# Patient Record
Sex: Male | Born: 1950 | Race: White | Hispanic: No | Smoking: Never smoker
Health system: Southern US, Community
[De-identification: ages and names within clinical notes are randomized; demographics above are authoritative.]

## PROBLEM LIST (undated history)

## (undated) DIAGNOSIS — I4891 Unspecified atrial fibrillation: Secondary | ICD-10-CM

## (undated) DIAGNOSIS — I10 Essential (primary) hypertension: Secondary | ICD-10-CM

## (undated) DIAGNOSIS — L570 Actinic keratosis: Secondary | ICD-10-CM

## (undated) HISTORY — DX: Essential (primary) hypertension: I10

## (undated) HISTORY — PX: CORNEAL TRANSPLANT: SHX108

## (undated) HISTORY — PX: TOE SURGERY: SHX1073

## (undated) HISTORY — PX: COLONOSCOPY: SHX174

---
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
## 2005-08-13 ENCOUNTER — Ambulatory Visit: Payer: Self-pay | Admitting: Unknown Physician Specialty

## 2008-10-30 ENCOUNTER — Ambulatory Visit: Payer: Self-pay | Admitting: Unknown Physician Specialty

## 2008-10-30 LAB — HM COLONOSCOPY

## 2011-08-20 ENCOUNTER — Ambulatory Visit: Payer: Self-pay | Admitting: Family Medicine

## 2011-09-02 ENCOUNTER — Ambulatory Visit: Payer: Self-pay | Admitting: Internal Medicine

## 2011-09-17 ENCOUNTER — Ambulatory Visit: Payer: Self-pay | Admitting: Internal Medicine

## 2014-02-23 LAB — LIPID PANEL
CHOLESTEROL: 176 mg/dL (ref 0–200)
HDL: 48 mg/dL (ref 35–70)
LDL Cholesterol: 113 mg/dL
LDl/HDL Ratio: 2.4
Triglycerides: 77 mg/dL (ref 40–160)

## 2014-02-23 LAB — PSA: PSA: 2.1

## 2014-02-23 LAB — TSH: TSH: 4.87 u[IU]/mL (ref 0.41–5.90)

## 2014-05-31 ENCOUNTER — Ambulatory Visit: Payer: Self-pay | Admitting: Family Medicine

## 2014-05-31 LAB — CBC AND DIFFERENTIAL
HCT: 41 % (ref 41–53)
Hemoglobin: 14.4 g/dL (ref 13.5–17.5)
NEUTROS ABS: 4 /uL
PLATELETS: 277 10*3/uL (ref 150–399)
WBC: 7.1 10^3/mL

## 2014-05-31 LAB — HEPATIC FUNCTION PANEL
ALK PHOS: 64 U/L (ref 25–125)
ALT: 13 U/L (ref 10–40)
AST: 13 U/L — AB (ref 14–40)
Bilirubin, Total: 0.5 mg/dL

## 2014-05-31 LAB — BASIC METABOLIC PANEL
BUN: 14 mg/dL (ref 4–21)
Creatinine: 1 mg/dL (ref 0.6–1.3)
GLUCOSE: 88 mg/dL
Potassium: 3.7 mmol/L (ref 3.4–5.3)
Sodium: 139 mmol/L (ref 137–147)

## 2014-09-04 DIAGNOSIS — G4733 Obstructive sleep apnea (adult) (pediatric): Secondary | ICD-10-CM | POA: Insufficient documentation

## 2015-01-02 DIAGNOSIS — L570 Actinic keratosis: Secondary | ICD-10-CM

## 2015-01-02 HISTORY — DX: Actinic keratosis: L57.0

## 2015-05-26 ENCOUNTER — Other Ambulatory Visit: Payer: Self-pay | Admitting: Family Medicine

## 2015-05-29 ENCOUNTER — Encounter: Payer: Self-pay | Admitting: Family Medicine

## 2015-06-21 ENCOUNTER — Encounter: Payer: Self-pay | Admitting: Emergency Medicine

## 2015-06-21 DIAGNOSIS — M810 Age-related osteoporosis without current pathological fracture: Secondary | ICD-10-CM | POA: Insufficient documentation

## 2015-06-21 DIAGNOSIS — I499 Cardiac arrhythmia, unspecified: Secondary | ICD-10-CM | POA: Insufficient documentation

## 2015-06-21 DIAGNOSIS — E039 Hypothyroidism, unspecified: Secondary | ICD-10-CM | POA: Insufficient documentation

## 2015-06-21 DIAGNOSIS — G4730 Sleep apnea, unspecified: Secondary | ICD-10-CM | POA: Insufficient documentation

## 2015-06-21 DIAGNOSIS — J309 Allergic rhinitis, unspecified: Secondary | ICD-10-CM | POA: Insufficient documentation

## 2015-06-21 DIAGNOSIS — M1990 Unspecified osteoarthritis, unspecified site: Secondary | ICD-10-CM | POA: Insufficient documentation

## 2015-06-21 DIAGNOSIS — M069 Rheumatoid arthritis, unspecified: Secondary | ICD-10-CM | POA: Insufficient documentation

## 2015-06-21 DIAGNOSIS — I4891 Unspecified atrial fibrillation: Secondary | ICD-10-CM | POA: Insufficient documentation

## 2015-06-26 ENCOUNTER — Encounter: Payer: Self-pay | Admitting: Family Medicine

## 2015-07-24 ENCOUNTER — Encounter: Payer: Self-pay | Admitting: Family Medicine

## 2015-08-05 ENCOUNTER — Ambulatory Visit (INDEPENDENT_AMBULATORY_CARE_PROVIDER_SITE_OTHER): Payer: BLUE CROSS/BLUE SHIELD | Admitting: Family Medicine

## 2015-08-05 VITALS — BP 102/58 | HR 58 | Temp 98.4°F | Resp 16 | Ht 70.5 in | Wt 211.0 lb

## 2015-08-05 DIAGNOSIS — K921 Melena: Secondary | ICD-10-CM

## 2015-08-05 DIAGNOSIS — G4730 Sleep apnea, unspecified: Secondary | ICD-10-CM

## 2015-08-05 DIAGNOSIS — Z0000 Encounter for general adult medical examination without abnormal findings: Secondary | ICD-10-CM

## 2015-08-05 DIAGNOSIS — Z125 Encounter for screening for malignant neoplasm of prostate: Secondary | ICD-10-CM

## 2015-08-05 DIAGNOSIS — Z1211 Encounter for screening for malignant neoplasm of colon: Secondary | ICD-10-CM

## 2015-08-05 LAB — POCT URINALYSIS DIPSTICK
Bilirubin, UA: NEGATIVE
Blood, UA: NEGATIVE
Glucose, UA: NEGATIVE
KETONES UA: NEGATIVE
LEUKOCYTES UA: NEGATIVE
Nitrite, UA: NEGATIVE
PH UA: 6
PROTEIN UA: NEGATIVE
SPEC GRAV UA: 1.01
UROBILINOGEN UA: NEGATIVE

## 2015-08-05 LAB — IFOBT (OCCULT BLOOD): IMMUNOLOGICAL FECAL OCCULT BLOOD TEST: POSITIVE

## 2015-08-05 NOTE — Progress Notes (Signed)
Patient ID: John Baxter, male   DOB: August 24, 1950, 64 y.o.   MRN: [id number] Patient: John Baxter, Male    DOB: 1951/04/12, 64 y.o.   MRN: [id number] Visit Date: 08/05/2015  Today's Provider: [name], MD   Chief Complaint  Patient presents with  . Annual Exam   Subjective:  John Baxter is a 64 y.o. male who presents today for health maintenance and complete physical. He feels well. He reports exercising walks daily. He reports he is sleeping well   Review of Systems  Constitutional: Negative for fever, chills, diaphoresis, activity change, appetite change, fatigue and unexpected weight change.  HENT: Negative for congestion, dental problem, drooling, ear discharge, ear pain, facial swelling, hearing loss, mouth sores, nosebleeds, postnasal drip, rhinorrhea, sinus pressure, sneezing, sore throat, tinnitus, trouble swallowing and voice change.   Eyes: Negative for photophobia, pain, discharge, redness, itching and visual disturbance.  Respiratory: Positive for apnea. Negative for cough, choking, chest tightness, shortness of breath, wheezing and stridor.   Cardiovascular: Negative for chest pain, palpitations and leg swelling.  Gastrointestinal: Negative for nausea, vomiting, abdominal pain, diarrhea, constipation, blood in stool, abdominal distention, anal bleeding and rectal pain.  Endocrine: Negative for cold intolerance, heat intolerance, polydipsia, polyphagia and polyuria.  Genitourinary: Negative for dysuria, urgency, frequency, hematuria, flank pain, decreased urine volume, discharge, penile swelling, scrotal swelling, enuresis, difficulty urinating, genital sores, penile pain and testicular pain.  Musculoskeletal: Negative for myalgias, back pain, joint swelling, arthralgias, gait problem, neck pain and neck stiffness.  Skin: Negative for color change, pallor, rash and wound.  Allergic/Immunologic: Negative for environmental allergies, food allergies and  immunocompromised state.  Neurological: Negative for dizziness, tremors, seizures, syncope, facial asymmetry, speech difficulty, weakness, light-headedness, numbness and headaches.  Hematological: Negative for adenopathy. Does not bruise/bleed easily.  Psychiatric/Behavioral: Negative for suicidal ideas, hallucinations, behavioral problems, confusion, sleep disturbance, self-injury, dysphoric mood, decreased concentration and agitation. The patient is not nervous/anxious and is not hyperactive.     Social History   Social History  . Marital Status: Married    Spouse Name: N/A  . Number of Children: N/A  . Years of Education: N/A   Occupational History  . Not on file.   Social History Main Topics  . Smoking status: Never Smoker   . Smokeless tobacco: Not on file  . Alcohol Use: Yes     Comment: occasionally   . Drug Use: No  . Sexual Activity: Not on file   Other Topics Concern  . Not on file   Social History Narrative  . No narrative on file    Patient Active Problem List   Diagnosis Date Noted  . Allergic rhinitis 06/21/2015  . Cardiac dysrhythmia 06/21/2015  . Atrial fibrillation ([place]) 06/21/2015  . Adult hypothyroidism 06/21/2015  . Arthritis, degenerative 06/21/2015  . OP (osteoporosis) 06/21/2015  . Rheumatoid arthritis ([place]) 06/21/2015  . Apnea, sleep 06/21/2015    Past Surgical History  Procedure Laterality Date  . Toe surgery      Ingrown nail x2    His family history includes Arthritis in his mother; Blindness (age of onset: 48) in his father; Cancer in his father; Heart disease in his father; Stroke in his mother.    Outpatient Prescriptions Prior to Visit  Medication Sig Dispense Refill  . ADALIMUMAB Inject into the skin.    Marland Kitchen aspirin (ASPIRIN ADULT LOW STRENGTH) 81 MG chewable tablet Chew by mouth.    . folic acid (FOLVITE) 1 MG tablet Take by  mouth.    . methotrexate 2.5 MG tablet Take by mouth.    . metoprolol succinate (TOPROL XL) 25 MG 24 hr  tablet Take by mouth.    . SYNTHROID 88 MCG tablet TAKE 1 TABLET BY MOUTH EVERY DAY 30 tablet 6   No facility-administered medications prior to visit.   Outpatient Encounter Prescriptions as of 08/05/2015  Medication Sig Note  . ADALIMUMAB Inject into the skin. 06/21/2015: Received from: [facility]  . aspirin (ASPIRIN ADULT LOW STRENGTH) 81 MG chewable tablet Chew by mouth. 06/21/2015: Received from: [facility]  . folic acid (FOLVITE) 1 MG tablet Take by mouth. 06/21/2015: Received from: [facility]  . methotrexate 2.5 MG tablet Take by mouth. 06/21/2015: Received from: [facility]  . metoprolol succinate (TOPROL XL) 25 MG 24 hr tablet Take by mouth. 06/21/2015: Received from: [facility]  . SYNTHROID 88 MCG tablet TAKE 1 TABLET BY MOUTH EVERY DAY    No facility-administered encounter medications on file as of 08/05/2015.    Patient Care Team: Jerrol Banana., MD as PCP - General (Family Medicine)     Objective:   Vitals:  Filed Vitals:   08/05/15 0913  BP: 102/58  Pulse: 58  Temp: 98.4 F (36.9 C)  TempSrc: Oral  Resp: 16  Height: 5' 10.5" (1.791 m)  Weight: 211 lb (95.709 kg)    Physical Exam  Constitutional: He is oriented to person, place, and time. He appears well-developed and well-nourished.  HENT:  Head: Normocephalic and atraumatic.  Right Ear: External ear normal.  Left Ear: External ear normal.  Nose: Nose normal.  Mouth/Throat: Oropharynx is clear and moist.  3+ tonsils  Eyes: Conjunctivae and EOM are normal. Pupils are equal, round, and reactive to light.  Neck: Normal range of motion. Neck supple.  Cardiovascular: Normal rate, regular rhythm, normal heart sounds and intact distal pulses.   Pulmonary/Chest: Effort normal and breath sounds normal.  Abdominal: Soft. Bowel sounds are normal.  Genitourinary: Rectum normal, prostate normal and penis normal.   Musculoskeletal: Normal range of motion.  Neurological: He is alert and oriented to person, place, and time.  Skin: Skin is warm and dry.  Psychiatric: He has a normal mood and affect. His behavior is normal. Judgment and thought content normal.     Depression Screen No flowsheet data found.    Assessment & Plan:     Routine Health Maintenance and Physical Exam  Exercise Activities and Dietary recommendations Goals    None      Immunization History  Administered Date(s) Administered  . Tdap 09/11/2008    Health Maintenance  Topic Date Due  . Hepatitis C Screening  [birth year]  . HIV Screening  11/20/1965  . ZOSTAVAX  11/21/2010  . INFLUENZA VACCINE  03/18/2015  . TETANUS/TDAP  09/11/2018  . COLONOSCOPY  10/31/2018   1. Annual physical exam  - CBC With Differential/Platelet - Lipid Panel With LDL/HDL Ratio - COMPLETE METABOLIC PANEL WITH GFR - TSH - POCT urinalysis dipstick  2. Prostate cancer screening  - PSA  3. Blood in stool  - Ambulatory referral to Gastroenterology  4. Colon cancer screening  - IFOBT POC (occult bld, rslt in office)  5. Sleep apnea Patient uses CPAP nightly. Discussed possible ENT for enlarged tonsils as this might be contributing to OSA. Patient to schedule for flu vaccine-Not done today due to him taking his Humira injection and his Methotrexate.    Discussed health benefits of  physical activity, and encouraged him to engage in regular exercise appropriate for his age and condition.    ------------------------------------------------------------------------------------------------------------

## 2015-08-13 LAB — CBC WITH DIFFERENTIAL/PLATELET

## 2015-08-13 LAB — COMPREHENSIVE METABOLIC PANEL
A/G RATIO: 1.3 (ref 1.1–2.5)
ALBUMIN: 3.9 g/dL (ref 3.6–4.8)
ALT: 18 IU/L (ref 0–44)
AST: 18 IU/L (ref 0–40)
Alkaline Phosphatase: 53 IU/L (ref 39–117)
BUN/Creatinine Ratio: 18 (ref 10–22)
BUN: 17 mg/dL (ref 8–27)
Bilirubin Total: 0.6 mg/dL (ref 0.0–1.2)
CALCIUM: 8.8 mg/dL (ref 8.6–10.2)
CHLORIDE: 106 mmol/L (ref 96–106)
CO2: 24 mmol/L (ref 18–29)
CREATININE: 0.95 mg/dL (ref 0.76–1.27)
GFR, EST AFRICAN AMERICAN: 97 mL/min/{1.73_m2} (ref >59)
GFR, EST NON AFRICAN AMERICAN: 84 mL/min/{1.73_m2} (ref >59)
GLOBULIN, TOTAL: 3.1 g/dL (ref 1.5–4.5)
Glucose: 101 mg/dL — ABNORMAL HIGH (ref 65–99)
Potassium: 4.4 mmol/L (ref 3.5–5.2)
Sodium: 142 mmol/L (ref 134–144)
TOTAL PROTEIN: 7 g/dL (ref 6.0–8.5)

## 2015-08-13 LAB — LIPID PANEL WITH LDL/HDL RATIO
Cholesterol, Total: 161 mg/dL (ref 100–199)
HDL: 53 mg/dL (ref >39)
LDL Calculated: 99 mg/dL (ref 0–99)
LDl/HDL Ratio: 1.9 ratio units (ref 0.0–3.6)
TRIGLYCERIDES: 47 mg/dL (ref 0–149)
VLDL CHOLESTEROL CAL: 9 mg/dL (ref 5–40)

## 2015-08-13 LAB — PSA: Prostate Specific Ag, Serum: 2.2 ng/mL (ref 0.0–4.0)

## 2015-08-13 LAB — TSH: TSH: 4.26 u[IU]/mL (ref 0.450–4.500)

## 2015-08-14 ENCOUNTER — Ambulatory Visit (INDEPENDENT_AMBULATORY_CARE_PROVIDER_SITE_OTHER): Payer: BLUE CROSS/BLUE SHIELD

## 2015-08-14 DIAGNOSIS — Z23 Encounter for immunization: Secondary | ICD-10-CM

## 2015-10-23 LAB — HM COLONOSCOPY: HM Colonoscopy: 1

## 2015-12-02 ENCOUNTER — Encounter: Payer: Self-pay | Admitting: Family Medicine

## 2015-12-20 ENCOUNTER — Other Ambulatory Visit: Payer: Self-pay | Admitting: Family Medicine

## 2015-12-28 ENCOUNTER — Other Ambulatory Visit: Payer: Self-pay | Admitting: Family Medicine

## 2016-01-24 DIAGNOSIS — M0579 Rheumatoid arthritis with rheumatoid factor of multiple sites without organ or systems involvement: Secondary | ICD-10-CM | POA: Diagnosis not present

## 2016-01-24 DIAGNOSIS — M150 Primary generalized (osteo)arthritis: Secondary | ICD-10-CM | POA: Diagnosis not present

## 2016-01-31 DIAGNOSIS — M0579 Rheumatoid arthritis with rheumatoid factor of multiple sites without organ or systems involvement: Secondary | ICD-10-CM | POA: Diagnosis not present

## 2016-01-31 DIAGNOSIS — M150 Primary generalized (osteo)arthritis: Secondary | ICD-10-CM | POA: Diagnosis not present

## 2016-02-03 ENCOUNTER — Ambulatory Visit: Payer: BLUE CROSS/BLUE SHIELD | Admitting: Family Medicine

## 2016-02-13 DIAGNOSIS — H6123 Impacted cerumen, bilateral: Secondary | ICD-10-CM | POA: Diagnosis not present

## 2016-02-13 DIAGNOSIS — H93293 Other abnormal auditory perceptions, bilateral: Secondary | ICD-10-CM | POA: Diagnosis not present

## 2016-02-28 DIAGNOSIS — I480 Paroxysmal atrial fibrillation: Secondary | ICD-10-CM | POA: Diagnosis not present

## 2016-02-28 DIAGNOSIS — R001 Bradycardia, unspecified: Secondary | ICD-10-CM | POA: Diagnosis not present

## 2016-02-28 DIAGNOSIS — I340 Nonrheumatic mitral (valve) insufficiency: Secondary | ICD-10-CM | POA: Diagnosis not present

## 2016-02-28 DIAGNOSIS — G4733 Obstructive sleep apnea (adult) (pediatric): Secondary | ICD-10-CM | POA: Diagnosis not present

## 2016-05-19 DIAGNOSIS — M0579 Rheumatoid arthritis with rheumatoid factor of multiple sites without organ or systems involvement: Secondary | ICD-10-CM | POA: Diagnosis not present

## 2016-06-24 ENCOUNTER — Ambulatory Visit (INDEPENDENT_AMBULATORY_CARE_PROVIDER_SITE_OTHER): Payer: BLUE CROSS/BLUE SHIELD | Admitting: Family Medicine

## 2016-06-24 DIAGNOSIS — Z23 Encounter for immunization: Secondary | ICD-10-CM | POA: Diagnosis not present

## 2016-07-31 DIAGNOSIS — M0579 Rheumatoid arthritis with rheumatoid factor of multiple sites without organ or systems involvement: Secondary | ICD-10-CM | POA: Diagnosis not present

## 2016-08-05 ENCOUNTER — Ambulatory Visit (INDEPENDENT_AMBULATORY_CARE_PROVIDER_SITE_OTHER): Payer: BLUE CROSS/BLUE SHIELD | Admitting: Family Medicine

## 2016-08-05 ENCOUNTER — Encounter: Payer: Self-pay | Admitting: Family Medicine

## 2016-08-05 VITALS — BP 102/60 | HR 58 | Temp 97.7°F | Resp 16 | Ht 71.0 in | Wt 213.0 lb

## 2016-08-05 DIAGNOSIS — Z1211 Encounter for screening for malignant neoplasm of colon: Secondary | ICD-10-CM | POA: Diagnosis not present

## 2016-08-05 DIAGNOSIS — Z125 Encounter for screening for malignant neoplasm of prostate: Secondary | ICD-10-CM

## 2016-08-05 DIAGNOSIS — Z0000 Encounter for general adult medical examination without abnormal findings: Secondary | ICD-10-CM

## 2016-08-05 DIAGNOSIS — Z23 Encounter for immunization: Secondary | ICD-10-CM | POA: Diagnosis not present

## 2016-08-05 DIAGNOSIS — S61211A Laceration without foreign body of left index finger without damage to nail, initial encounter: Secondary | ICD-10-CM | POA: Diagnosis not present

## 2016-08-05 LAB — POCT URINALYSIS DIPSTICK
Bilirubin, UA: NEGATIVE
Glucose, UA: NEGATIVE
Ketones, UA: NEGATIVE
Leukocytes, UA: NEGATIVE
Nitrite, UA: NEGATIVE
PH UA: 7
PROTEIN UA: NEGATIVE
RBC UA: NEGATIVE
SPEC GRAV UA: 1.01
UROBILINOGEN UA: 0.2

## 2016-08-05 LAB — IFOBT (OCCULT BLOOD): IMMUNOLOGICAL FECAL OCCULT BLOOD TEST: NEGATIVE

## 2016-08-05 NOTE — Progress Notes (Signed)
Patient: John Baxter, Male    DOB: 02/09/1951, 65 y.o.   MRN: [id number] Visit Date: 08/05/2016  Today's Provider: [name], MD   Chief Complaint  Patient presents with  . Annual Exam   Subjective:    Annual physical exam John Baxter is a 65 y.o. male who presents today for health maintenance and complete physical. He feels well. He reports exercising walks with his dogs daily. He reports he is sleeping well. His RA is stable and has been for years.Due to his RA therapy he should not receive live vaccines.  ----------------------------------------------------------------- Colonoscopy- 10/23/15 Tubular adenoma repeat 10/2020  Immunization History  Administered Date(s) Administered  . Influenza, High Dose Seasonal PF 06/24/2016  . Influenza,inj,Quad PF,36+ Mos 08/14/2015  . Tdap 09/11/2008   Pt would like to discuss the Pneumonia vaccines with rheumatologist first before getting these.  Review of Systems  Constitutional: Negative.   HENT: Negative.        Recently pt has noticed a dry mouth.  Eyes: Negative.   Respiratory: Negative.   Cardiovascular: Negative.   Gastrointestinal: Negative.   Endocrine: Negative.   Genitourinary: Negative.   Musculoskeletal: Negative.   Skin: Negative.   Allergic/Immunologic: Negative.   Neurological: Negative.   Hematological: Negative.   Psychiatric/Behavioral: Negative.     Social History      He  reports that he has never smoked. He has never used smokeless tobacco. He reports that he drinks alcohol. He reports that he does not use drugs.       Social History   Social History  . Marital status: Married    Spouse name: N/A  . Number of children: N/A  . Years of education: N/A   Social History Main Topics  . Smoking status: Never Smoker  . Smokeless tobacco: Never Used  . Alcohol use Yes     Comment: occasionally 3 beers a week  . Drug use: No  . Sexual activity: Not Asked   Other Topics  Concern  . None   Social History Narrative  . None    History reviewed. No pertinent past medical history.   Patient Active Problem List   Diagnosis Date Noted  . Allergic rhinitis 06/21/2015  . Cardiac dysrhythmia 06/21/2015  . Atrial fibrillation ([place]) 06/21/2015  . Adult hypothyroidism 06/21/2015  . Arthritis, degenerative 06/21/2015  . OP (osteoporosis) 06/21/2015  . Rheumatoid arthritis ([place]) 06/21/2015  . Apnea, sleep 06/21/2015    Past Surgical History:  Procedure Laterality Date  . TOE SURGERY     Ingrown nail x2    Family History        Family Status  Relation Status  . Mother Deceased   died in 58. Cause of death was stroke  . Father Deceased at age 71   Cause of death was lung cancer and CHF  . Sister Alive  . Sister Alive        His family history includes Arthritis in his mother; Blindness (age of onset: 105) in his father; Cancer in his father; Heart disease in his father; Stroke in his mother.     No Known Allergies   Current Outpatient Prescriptions:  .  Adalimumab (HUMIRA PEN) 40 MG/0.8ML PNKT, INJECT 40MG (1 PEN) SUBCUTANEOUSLY EVERY 14 DAYS, Disp: , Rfl:  .  aspirin (ASPIRIN ADULT LOW STRENGTH) 81 MG chewable tablet, Chew by mouth., Disp: , Rfl:  .  folic acid (FOLVITE) 1 MG tablet, Take by mouth., Disp: ,  Rfl:  .  methotrexate 2.5 MG tablet, Take by mouth., Disp: , Rfl:  .  metoprolol succinate (TOPROL XL) 25 MG 24 hr tablet, Take by mouth., Disp: , Rfl:  .  SYNTHROID 88 MCG tablet, take 1 tablet by mouth once daily, Disp: 30 tablet, Rfl: 12   Patient Care Team: Jerrol Banana., MD as PCP - General (Family Medicine)      Objective:   Vitals: BP 102/60 (BP Location: Left Arm, Patient Position: Sitting, Cuff Size: Large)   Pulse (!) 58   Temp 97.7 F (36.5 C) (Oral)   Resp 16   Ht 5\' 11"  (1.803 m)   Wt 213 lb (96.6 kg)   BMI 29.71 kg/m    Physical Exam  Constitutional: He is oriented to person, place, and time. He appears  well-developed and well-nourished.  HENT:  Head: Normocephalic and atraumatic.  Right Ear: External ear normal.  Left Ear: External ear normal.  Nose: Nose normal.  Mouth/Throat: Oropharynx is clear and moist.  Eyes: Conjunctivae and EOM are normal. Pupils are equal, round, and reactive to light.  Neck: Normal range of motion. Neck supple.  Cardiovascular: Normal rate, regular rhythm, normal heart sounds and intact distal pulses.   Pulmonary/Chest: Effort normal and breath sounds normal.  Abdominal: Soft. Bowel sounds are normal.  Genitourinary: Penis normal.  Musculoskeletal: Normal range of motion.  Neurological: He is alert and oriented to person, place, and time. He has normal reflexes.  Skin: Skin is warm and dry.  3 dime size macular,purple discreet lesions on left side of face just anterior to left ear. Fair skin.  Psychiatric: He has a normal mood and affect. His behavior is normal. Judgment and thought content normal.     Depression Screen PHQ 2/9 Scores 08/05/2016  PHQ - 2 Score 0      Assessment & Plan:     Routine Health Maintenance and Physical Exam  Exercise Activities and Dietary recommendations Goals    None      Immunization History  Administered Date(s) Administered  . Influenza, High Dose Seasonal PF 06/24/2016  . Influenza,inj,Quad PF,36+ Mos 08/14/2015  . Tdap 09/11/2008    Health Maintenance  Topic Date Due  . Hepatitis C Screening  Jan 28, 1951  . HIV Screening  11/20/1965  . ZOSTAVAX  11/21/2010  . PNA vac Low Risk Adult (1 of 2 - PCV13) 11/21/2015  . TETANUS/TDAP  09/11/2018  . COLONOSCOPY  10/22/2020  . INFLUENZA VACCINE  Completed    Prevnar today. Discussed health benefits of physical activity, and encouraged him to engage in regular exercise appropriate for his age and condition.  Rheumatoid Arthritis On methotrexate and Humira per Dr. Jefm Bryant. OSA Wearing CPAP for 20 years. Needs new mask. OA GERD Hypothyroid Rash Refer  to dermatology    --------------------------------------------------------------------  .I have done the exam and reviewed the above chart and it is accurate to the best of my knowledge. Development worker, community has been used in this note in any air is in the dictation or transcription are unintentional.   [name], MD  [place]

## 2016-08-11 DIAGNOSIS — Z0000 Encounter for general adult medical examination without abnormal findings: Secondary | ICD-10-CM | POA: Diagnosis not present

## 2016-08-11 DIAGNOSIS — Z125 Encounter for screening for malignant neoplasm of prostate: Secondary | ICD-10-CM | POA: Diagnosis not present

## 2016-08-12 LAB — LIPID PANEL WITH LDL/HDL RATIO
Cholesterol, Total: 167 mg/dL (ref 100–199)
HDL: 41 mg/dL (ref >39)
LDL CALC: 100 mg/dL — AB (ref 0–99)
LDL/HDL RATIO: 2.4 ratio (ref 0.0–3.6)
TRIGLYCERIDES: 132 mg/dL (ref 0–149)
VLDL Cholesterol Cal: 26 mg/dL (ref 5–40)

## 2016-08-12 LAB — COMPREHENSIVE METABOLIC PANEL
A/G RATIO: 1.2 (ref 1.2–2.2)
ALBUMIN: 4.1 g/dL (ref 3.6–4.8)
ALK PHOS: 54 IU/L (ref 39–117)
ALT: 19 IU/L (ref 0–44)
AST: 16 IU/L (ref 0–40)
BILIRUBIN TOTAL: 0.5 mg/dL (ref 0.0–1.2)
BUN / CREAT RATIO: 11 (ref 10–24)
BUN: 13 mg/dL (ref 8–27)
CHLORIDE: 103 mmol/L (ref 96–106)
CO2: 23 mmol/L (ref 18–29)
CREATININE: 1.14 mg/dL (ref 0.76–1.27)
Calcium: 9 mg/dL (ref 8.6–10.2)
GFR calc Af Amer: 78 mL/min/{1.73_m2} (ref >59)
GFR calc non Af Amer: 67 mL/min/{1.73_m2} (ref >59)
GLOBULIN, TOTAL: 3.4 g/dL (ref 1.5–4.5)
Glucose: 98 mg/dL (ref 65–99)
POTASSIUM: 4.4 mmol/L (ref 3.5–5.2)
SODIUM: 142 mmol/L (ref 134–144)
Total Protein: 7.5 g/dL (ref 6.0–8.5)

## 2016-08-12 LAB — CBC WITH DIFFERENTIAL/PLATELET
BASOS: 1 %
Basophils Absolute: 0 10*3/uL (ref 0.0–0.2)
EOS (ABSOLUTE): 0.2 10*3/uL (ref 0.0–0.4)
EOS: 4 %
HEMATOCRIT: 42.9 % (ref 37.5–51.0)
HEMOGLOBIN: 14.5 g/dL (ref 13.0–17.7)
IMMATURE GRANULOCYTES: 0 %
Immature Grans (Abs): 0 10*3/uL (ref 0.0–0.1)
LYMPHS ABS: 2 10*3/uL (ref 0.7–3.1)
Lymphs: 34 %
MCH: 30.9 pg (ref 26.6–33.0)
MCHC: 33.8 g/dL (ref 31.5–35.7)
MCV: 91 fL (ref 79–97)
MONOCYTES: 9 %
MONOS ABS: 0.5 10*3/uL (ref 0.1–0.9)
NEUTROS PCT: 52 %
Neutrophils Absolute: 3 10*3/uL (ref 1.4–7.0)
Platelets: 337 10*3/uL (ref 150–379)
RBC: 4.7 x10E6/uL (ref 4.14–5.80)
RDW: 14 % (ref 12.3–15.4)
WBC: 5.8 10*3/uL (ref 3.4–10.8)

## 2016-08-12 LAB — TSH: TSH: 5.44 u[IU]/mL — AB (ref 0.450–4.500)

## 2016-08-12 LAB — PSA: Prostate Specific Ag, Serum: 3.1 ng/mL (ref 0.0–4.0)

## 2016-08-14 DIAGNOSIS — S61211D Laceration without foreign body of left index finger without damage to nail, subsequent encounter: Secondary | ICD-10-CM | POA: Diagnosis not present

## 2016-08-18 ENCOUNTER — Telehealth: Payer: Self-pay

## 2016-08-19 NOTE — Telephone Encounter (Signed)
Error. sd 

## 2016-08-21 ENCOUNTER — Telehealth: Payer: Self-pay

## 2016-08-21 MED ORDER — SYNTHROID 100 MCG PO TABS: 100.0000 ug | ORAL_TABLET | Freq: Every day | ORAL | 3 refills | Status: DC

## 2016-08-21 NOTE — Telephone Encounter (Signed)
-----   Message from Jerrol Banana., MD sent at 08/18/2016  1:40 PM EST ----- Labs OK. Maybe increase synthroid from 88 to 125mcg daily.

## 2016-08-24 DIAGNOSIS — G4733 Obstructive sleep apnea (adult) (pediatric): Secondary | ICD-10-CM | POA: Diagnosis not present

## 2016-08-24 DIAGNOSIS — I480 Paroxysmal atrial fibrillation: Secondary | ICD-10-CM | POA: Diagnosis not present

## 2016-08-24 DIAGNOSIS — I340 Nonrheumatic mitral (valve) insufficiency: Secondary | ICD-10-CM | POA: Diagnosis not present

## 2016-08-24 DIAGNOSIS — I071 Rheumatic tricuspid insufficiency: Secondary | ICD-10-CM | POA: Diagnosis not present

## 2016-08-25 ENCOUNTER — Telehealth: Payer: Self-pay | Admitting: Family Medicine

## 2016-08-25 NOTE — Telephone Encounter (Signed)
Pt is requesting the Rx for SYNTHROID 100 MCG tablet be written for the generic instead to help with out of pocket cost. [facility] [address]. Please advise. Thanks TNP

## 2016-08-26 MED ORDER — LEVOTHYROXINE SODIUM 100 MCG PO TABS: 100.0000 ug | ORAL_TABLET | Freq: Every day | ORAL | 3 refills | Status: DC

## 2016-08-26 NOTE — Telephone Encounter (Signed)
Levothyroxine sent to [facility]. Patient advised.

## 2016-08-26 NOTE — Telephone Encounter (Signed)
Absolutely yes

## 2016-10-30 DIAGNOSIS — M0579 Rheumatoid arthritis with rheumatoid factor of multiple sites without organ or systems involvement: Secondary | ICD-10-CM | POA: Diagnosis not present

## 2016-12-07 ENCOUNTER — Ambulatory Visit (INDEPENDENT_AMBULATORY_CARE_PROVIDER_SITE_OTHER): Payer: BLUE CROSS/BLUE SHIELD | Admitting: Family Medicine

## 2016-12-07 ENCOUNTER — Encounter: Payer: Self-pay | Admitting: Family Medicine

## 2016-12-07 VITALS — BP 110/74 | HR 68 | Temp 98.3°F | Resp 16 | Wt 208.2 lb

## 2016-12-07 DIAGNOSIS — J069 Acute upper respiratory infection, unspecified: Secondary | ICD-10-CM | POA: Diagnosis not present

## 2016-12-07 MED ORDER — AMOXICILLIN-POT CLAVULANATE 875-125 MG PO TABS
1.0000 | ORAL_TABLET | Freq: Two times a day (BID) | ORAL | 0 refills | Status: DC
Start: 2016-12-07 — End: 2017-10-06

## 2016-12-07 NOTE — Progress Notes (Signed)
Subjective:     Patient ID: John Baxter, male   DOB: 03/30/1951, 66 y.o.   MRN: [id number]  HPI  Chief Complaint  Patient presents with  . URI    Patient comes in office with concerns of cold like symptoms for the past 2-3 days. Patient states that on 4/20 after cutting grass he began to sneeze, patient complains of the following symptoms. Cough,sore throat, and being hoarse, he has been taking otcMucinex.    States he had temp of 99.5 at home. States he is no longer on Humira but remains on MTX.   Review of Systems     Objective:   Physical Exam  Constitutional: He appears well-developed and well-nourished. No distress.  Ears: T.M's intact without inflammation Throat: mild tonsillar enlargement  Without exudate Neck: no cervical adenopathy Lungs: clear     Assessment:    1. Viral upper respiratory tract infection - amoxicillin-clavulanate (AUGMENTIN) 875-125 MG tablet; Take 1 tablet by mouth 2 (two) times daily.  Dispense: 20 tablet; Refill: 0    Plan:    To start abx if sinuses not improving over the course of the week. Discussed Benadryl at night for PND, Delsym for cough, and continued use of Mucinex.

## 2016-12-07 NOTE — Patient Instructions (Addendum)
Discussed use of Benadryl at night for post nasal drainage, Mucinex for an expectorant, and Delsym for cough. Start the antibiotic if sinuses not improving over the next 7 days.

## 2017-01-18 DIAGNOSIS — M25512 Pain in left shoulder: Secondary | ICD-10-CM | POA: Diagnosis not present

## 2017-01-18 DIAGNOSIS — M0579 Rheumatoid arthritis with rheumatoid factor of multiple sites without organ or systems involvement: Secondary | ICD-10-CM | POA: Diagnosis not present

## 2017-02-22 DIAGNOSIS — I071 Rheumatic tricuspid insufficiency: Secondary | ICD-10-CM | POA: Diagnosis not present

## 2017-02-22 DIAGNOSIS — I480 Paroxysmal atrial fibrillation: Secondary | ICD-10-CM | POA: Diagnosis not present

## 2017-02-22 DIAGNOSIS — I482 Chronic atrial fibrillation, unspecified: Secondary | ICD-10-CM | POA: Insufficient documentation

## 2017-02-22 DIAGNOSIS — G4733 Obstructive sleep apnea (adult) (pediatric): Secondary | ICD-10-CM | POA: Diagnosis not present

## 2017-03-26 DIAGNOSIS — Z79899 Other long term (current) drug therapy: Secondary | ICD-10-CM | POA: Diagnosis not present

## 2017-03-26 DIAGNOSIS — M0579 Rheumatoid arthritis with rheumatoid factor of multiple sites without organ or systems involvement: Secondary | ICD-10-CM | POA: Diagnosis not present

## 2017-05-26 DIAGNOSIS — G4733 Obstructive sleep apnea (adult) (pediatric): Secondary | ICD-10-CM | POA: Diagnosis not present

## 2017-08-27 DIAGNOSIS — I482 Chronic atrial fibrillation: Secondary | ICD-10-CM | POA: Diagnosis not present

## 2017-08-27 DIAGNOSIS — E782 Mixed hyperlipidemia: Secondary | ICD-10-CM | POA: Diagnosis not present

## 2017-09-01 ENCOUNTER — Other Ambulatory Visit: Payer: Self-pay | Admitting: Family Medicine

## 2017-09-02 ENCOUNTER — Other Ambulatory Visit: Payer: Self-pay | Admitting: Family Medicine

## 2017-09-02 NOTE — Telephone Encounter (Signed)
Pt called and scheduled a appointment for a CPE on Feb. 20 and would like to know if he can get a refill on the following medication before his appointment. Pt use's [facility].Thanks CC   levothyroxine (SYNTHROID, LEVOTHROID) 100 MCG tablet

## 2017-09-03 ENCOUNTER — Other Ambulatory Visit: Payer: Self-pay

## 2017-09-03 MED ORDER — LEVOTHYROXINE SODIUM 100 MCG PO TABS: 100.0000 ug | ORAL_TABLET | Freq: Every day | ORAL | 3 refills | Status: DC

## 2017-09-03 NOTE — Telephone Encounter (Signed)
Patient called about Levothyroxine medication it was sent in to [facility] and needs to go to [facility], RX re sent to the right [facility], RMA

## 2017-09-17 DIAGNOSIS — Z79899 Other long term (current) drug therapy: Secondary | ICD-10-CM | POA: Diagnosis not present

## 2017-09-17 DIAGNOSIS — M0579 Rheumatoid arthritis with rheumatoid factor of multiple sites without organ or systems involvement: Secondary | ICD-10-CM | POA: Diagnosis not present

## 2017-09-28 DIAGNOSIS — M79642 Pain in left hand: Secondary | ICD-10-CM | POA: Diagnosis not present

## 2017-09-28 DIAGNOSIS — M0579 Rheumatoid arthritis with rheumatoid factor of multiple sites without organ or systems involvement: Secondary | ICD-10-CM | POA: Diagnosis not present

## 2017-09-28 DIAGNOSIS — M791 Myalgia, unspecified site: Secondary | ICD-10-CM | POA: Diagnosis not present

## 2017-09-28 DIAGNOSIS — M25512 Pain in left shoulder: Secondary | ICD-10-CM | POA: Diagnosis not present

## 2017-10-06 ENCOUNTER — Encounter: Payer: Self-pay | Admitting: Family Medicine

## 2017-10-06 ENCOUNTER — Ambulatory Visit (INDEPENDENT_AMBULATORY_CARE_PROVIDER_SITE_OTHER): Payer: BLUE CROSS/BLUE SHIELD | Admitting: Family Medicine

## 2017-10-06 VITALS — BP 106/64 | HR 64 | Temp 97.8°F | Resp 16 | Ht 69.5 in | Wt 201.0 lb

## 2017-10-06 DIAGNOSIS — Z125 Encounter for screening for malignant neoplasm of prostate: Secondary | ICD-10-CM

## 2017-10-06 DIAGNOSIS — Z1211 Encounter for screening for malignant neoplasm of colon: Secondary | ICD-10-CM | POA: Diagnosis not present

## 2017-10-06 DIAGNOSIS — Z6829 Body mass index (BMI) 29.0-29.9, adult: Secondary | ICD-10-CM | POA: Diagnosis not present

## 2017-10-06 DIAGNOSIS — Z0000 Encounter for general adult medical examination without abnormal findings: Secondary | ICD-10-CM

## 2017-10-06 DIAGNOSIS — E039 Hypothyroidism, unspecified: Secondary | ICD-10-CM

## 2017-10-06 DIAGNOSIS — L570 Actinic keratosis: Secondary | ICD-10-CM | POA: Diagnosis not present

## 2017-10-06 LAB — IFOBT (OCCULT BLOOD): IFOBT: NEGATIVE

## 2017-10-06 NOTE — Progress Notes (Signed)
Patient: John Baxter, Male    DOB: Dec 23, 1950, 67 y.o.   MRN: [id number] Visit Date: 10/06/2017  Today's Provider: [name], MD   Chief Complaint  Patient presents with  . Annual Exam   Subjective:    Annual physical exam John Baxter is a 67 y.o. male who presents today for health maintenance and complete physical. He feels well. He reports exercising 2 times a day walking his dog. He reports he is sleeping well.  ----------------------------------------------------------------- Last CPE: 08/05/2016 Last Colonoscopy: 10/23/2015- Tubular Adenoma; Repeat in 2022    Review of Systems  Constitutional: Negative for appetite change, chills, fatigue and fever.  HENT: Negative for congestion, ear pain, hearing loss, nosebleeds and trouble swallowing.   Eyes: Negative for pain and visual disturbance.  Respiratory: Positive for apnea. Negative for cough, chest tightness and shortness of breath.   Cardiovascular: Negative for chest pain, palpitations and leg swelling.  Gastrointestinal: Negative for abdominal pain, blood in stool, constipation, diarrhea, nausea and vomiting.  Endocrine: Negative for polydipsia, polyphagia and polyuria.  Genitourinary: Negative for dysuria and flank pain.  Musculoskeletal: Negative for arthralgias, back pain, joint swelling, myalgias and neck stiffness.  Skin: Negative for color change, rash and wound.  Neurological: Negative for dizziness, tremors, seizures, speech difficulty, weakness, light-headedness and headaches.  Psychiatric/Behavioral: Negative for behavioral problems, confusion, decreased concentration, dysphoric mood and sleep disturbance. The patient is not nervous/anxious.   All other systems reviewed and are negative.   Social History      He  reports that  has never smoked. he has never used smokeless tobacco. He reports that he drinks alcohol. He reports that he does not use drugs.       Social History    Socioeconomic History  . Marital status: Married    Spouse name: None  . Number of children: 0  . Years of education: None  . Highest education level: None  Social Needs  . Financial resource strain: None  . Food insecurity - worry: None  . Food insecurity - inability: None  . Transportation needs - medical: None  . Transportation needs - non-medical: None  Occupational History    Comment: Pastoral care pastor  Tobacco Use  . Smoking status: Never Smoker  . Smokeless tobacco: Never Used  Substance and Sexual Activity  . Alcohol use: Yes    Comment: occasionally 3 beers a week  . Drug use: No  . Sexual activity: None  Other Topics Concern  . None  Social History Narrative  . None    No past medical history on file.   Patient Active Problem List   Diagnosis Date Noted  . Allergic rhinitis 06/21/2015  . Cardiac dysrhythmia 06/21/2015  . Atrial fibrillation ([place]) 06/21/2015  . Adult hypothyroidism 06/21/2015  . Arthritis, degenerative 06/21/2015  . OP (osteoporosis) 06/21/2015  . Rheumatoid arthritis ([place]) 06/21/2015  . Apnea, sleep 06/21/2015    Past Surgical History:  Procedure Laterality Date  . TOE SURGERY     Ingrown nail x2    Family History        Family Status  Relation Name Status  . Mother  Deceased       died in 38. Cause of death was stroke  . Father  Deceased at age 32       Cause of death was lung cancer and CHF  . Sister  Alive  . Sister  Alive  His family history includes Arthritis in his mother; Blindness (age of onset: 7) in his father; Cancer in his father; Heart disease in his father; Stroke in his mother.      No Known Allergies   Current Outpatient Medications:  .  aspirin (ASPIRIN ADULT LOW STRENGTH) 81 MG chewable tablet, Chew by mouth., Disp: , Rfl:  .  folic acid (FOLVITE) 1 MG tablet, Take by mouth., Disp: , Rfl:  .  levothyroxine (SYNTHROID, LEVOTHROID) 100 MCG tablet, Take 1 tablet (100 mcg total) by mouth  daily., Disp: 90 tablet, Rfl: 3 .  methotrexate 2.5 MG tablet, Take by mouth. Takes 8 tablets once a week, Disp: , Rfl:  .  metoprolol succinate (TOPROL XL) 25 MG 24 hr tablet, Take 25 mg by mouth daily. , Disp: , Rfl:    Patient Care Team: Jerrol Banana., MD as PCP - General (Family Medicine)      Objective:   Vitals: BP 106/64 (BP Location: Left Arm, Patient Position: Sitting, Cuff Size: Large)   Pulse 64   Temp 97.8 F (36.6 C) (Oral)   Resp 16   Ht 5' 9.5" (1.765 m)   Wt 201 lb (91.2 kg)   BMI 29.26 kg/m   There were no vitals filed for this visit.   Physical Exam  Constitutional: He is oriented to person, place, and time. He appears well-developed and well-nourished.  HENT:  Head: Normocephalic and atraumatic.  Right Ear: External ear normal.  Left Ear: External ear normal.  Nose: Nose normal.  Mouth/Throat: Oropharynx is clear and moist.  Eyes: Conjunctivae are normal. No scleral icterus.  Neck: No thyromegaly present.  Cardiovascular: Normal rate, regular rhythm and normal heart sounds.  Pulmonary/Chest: Effort normal and breath sounds normal.  Abdominal: Soft.  Genitourinary: Rectum normal, prostate normal and penis normal.  Neurological: He is alert and oriented to person, place, and time.  Skin: Skin is warm and dry.  Psychiatric: He has a normal mood and affect. His behavior is normal. Judgment and thought content normal.     Depression Screen PHQ 2/9 Scores 10/06/2017 08/05/2016  PHQ - 2 Score 0 0  PHQ- 9 Score 0 -    Cognitive Testing - 6-CIT  Correct? Score   What year is it? yes 0 0 or 4  What month is it? yes 0 0 or 3  Memorize:    Pia Mau,  42,  [address]  [place],      What time is it? (within 1 hour) yes 0 0 or 3  Count backwards from 20 yes 0 0, 2, or 4  Name the months of the year yes 0 0, 2, or 4  Repeat name & address above yes 0 0, 2, 4, 6, 8, or 10       TOTAL SCORE  0/28   Interpretation:  Normal  Normal (0-7) Abnormal  (8-28)    Current Exercise Habits: Home exercise routine, Type of exercise: walking, Time (Minutes): 20, Frequency (Times/Week): 2, Weekly Exercise (Minutes/Week): 40, Intensity: Mild Exercise limited by: None identified   Assessment & Plan:     Routine Health Maintenance and Physical Exam  Exercise Activities and Dietary recommendations Goals    None      Immunization History  Administered Date(s) Administered  . Influenza, High Dose Seasonal PF 06/24/2016  . Influenza,inj,Quad PF,6+ Mos 08/14/2015  . Pneumococcal Conjugate-13 08/05/2016  . Tdap 09/11/2008    Health Maintenance  Topic Date Due  . Hepatitis  C Screening  1950-08-19  . INFLUENZA VACCINE  03/17/2017  . PNA vac Low Risk Adult (2 of 2 - PPSV23) 08/05/2017  . TETANUS/TDAP  09/11/2018  . COLONOSCOPY  10/22/2020     Discussed health benefits of physical activity, and encouraged him to engage in regular exercise appropriate for his age and condition.    1. Annual physical exam: Patient is due for Pneumovax 23, which he declined today.  - Comprehensive Metabolic Panel (CMET) - Lipid panel - CBC with Differential/Platelet  2. Adult hypothyroidism - TSH  3. Screening for prostate cancer - PSA  4. BMI 29.0-29.9,adult  5. Actinic keratosis - Ambulatory referral to Dermatology  6. Screening for colon cancer - IFOBT POC (occult bld, rslt in office); Future      I have done the exam and reviewed the above chart and it is accurate to the best of my knowledge. Development worker, community has been used in this note in any air is in the dictation or transcription are unintentional.  [name], MD  [place]

## 2017-10-07 DIAGNOSIS — Z125 Encounter for screening for malignant neoplasm of prostate: Secondary | ICD-10-CM | POA: Diagnosis not present

## 2017-10-07 DIAGNOSIS — E039 Hypothyroidism, unspecified: Secondary | ICD-10-CM | POA: Diagnosis not present

## 2017-10-07 DIAGNOSIS — Z0000 Encounter for general adult medical examination without abnormal findings: Secondary | ICD-10-CM | POA: Diagnosis not present

## 2017-10-08 LAB — COMPREHENSIVE METABOLIC PANEL
ALBUMIN: 3.8 g/dL (ref 3.6–4.8)
ALT: 19 IU/L (ref 0–44)
AST: 17 IU/L (ref 0–40)
Albumin/Globulin Ratio: 1.2 (ref 1.2–2.2)
Alkaline Phosphatase: 52 IU/L (ref 39–117)
BUN/Creatinine Ratio: 18 (ref 10–24)
BUN: 18 mg/dL (ref 8–27)
Bilirubin Total: 0.7 mg/dL (ref 0.0–1.2)
CALCIUM: 8.9 mg/dL (ref 8.6–10.2)
CO2: 22 mmol/L (ref 20–29)
CREATININE: 1.01 mg/dL (ref 0.76–1.27)
Chloride: 105 mmol/L (ref 96–106)
GFR calc Af Amer: 89 mL/min/{1.73_m2} (ref >59)
GFR, EST NON AFRICAN AMERICAN: 77 mL/min/{1.73_m2} (ref >59)
GLOBULIN, TOTAL: 3.2 g/dL (ref 1.5–4.5)
GLUCOSE: 91 mg/dL (ref 65–99)
Potassium: 4.4 mmol/L (ref 3.5–5.2)
SODIUM: 140 mmol/L (ref 134–144)
Total Protein: 7 g/dL (ref 6.0–8.5)

## 2017-10-08 LAB — CBC WITH DIFFERENTIAL/PLATELET
BASOS: 0 %
Basophils Absolute: 0 10*3/uL (ref 0.0–0.2)
EOS (ABSOLUTE): 0.1 10*3/uL (ref 0.0–0.4)
EOS: 2 %
HEMATOCRIT: 39.2 % (ref 37.5–51.0)
HEMOGLOBIN: 13.3 g/dL (ref 13.0–17.7)
Immature Grans (Abs): 0 10*3/uL (ref 0.0–0.1)
Immature Granulocytes: 0 %
LYMPHS ABS: 1.5 10*3/uL (ref 0.7–3.1)
Lymphs: 29 %
MCH: 30.2 pg (ref 26.6–33.0)
MCHC: 33.9 g/dL (ref 31.5–35.7)
MCV: 89 fL (ref 79–97)
MONOCYTES: 8 %
MONOS ABS: 0.4 10*3/uL (ref 0.1–0.9)
NEUTROS ABS: 3.2 10*3/uL (ref 1.4–7.0)
Neutrophils: 61 %
Platelets: 259 10*3/uL (ref 150–379)
RBC: 4.4 x10E6/uL (ref 4.14–5.80)
RDW: 14 % (ref 12.3–15.4)
WBC: 5.2 10*3/uL (ref 3.4–10.8)

## 2017-10-08 LAB — LIPID PANEL
Chol/HDL Ratio: 2.9 ratio (ref 0.0–5.0)
Cholesterol, Total: 141 mg/dL (ref 100–199)
HDL: 49 mg/dL (ref >39)
LDL Calculated: 82 mg/dL (ref 0–99)
TRIGLYCERIDES: 50 mg/dL (ref 0–149)
VLDL CHOLESTEROL CAL: 10 mg/dL (ref 5–40)

## 2017-10-08 LAB — PSA: Prostate Specific Ag, Serum: 3.1 ng/mL (ref 0.0–4.0)

## 2017-10-08 LAB — TSH: TSH: 4.21 u[IU]/mL (ref 0.450–4.500)

## 2017-10-13 DIAGNOSIS — L578 Other skin changes due to chronic exposure to nonionizing radiation: Secondary | ICD-10-CM | POA: Diagnosis not present

## 2017-10-13 DIAGNOSIS — L812 Freckles: Secondary | ICD-10-CM | POA: Diagnosis not present

## 2017-10-13 DIAGNOSIS — L821 Other seborrheic keratosis: Secondary | ICD-10-CM | POA: Diagnosis not present

## 2017-10-13 DIAGNOSIS — L570 Actinic keratosis: Secondary | ICD-10-CM | POA: Diagnosis not present

## 2017-10-13 DIAGNOSIS — Z1283 Encounter for screening for malignant neoplasm of skin: Secondary | ICD-10-CM | POA: Diagnosis not present

## 2017-11-18 ENCOUNTER — Encounter: Payer: Self-pay | Admitting: Family Medicine

## 2017-11-18 ENCOUNTER — Ambulatory Visit: Payer: BLUE CROSS/BLUE SHIELD | Admitting: Family Medicine

## 2017-11-18 VITALS — BP 102/70 | HR 92 | Temp 98.1°F | Resp 16 | Wt 197.2 lb

## 2017-11-18 DIAGNOSIS — J019 Acute sinusitis, unspecified: Secondary | ICD-10-CM

## 2017-11-18 MED ORDER — HYDROCODONE-HOMATROPINE 5-1.5 MG/5ML PO SYRP: 5.0000 mL | ORAL_SOLUTION | Freq: Four times a day (QID) | ORAL | 0 refills | Status: DC | PRN

## 2017-11-18 MED ORDER — HYDROCODONE-HOMATROPINE 5-1.5 MG/5ML PO SYRP: 5.0000 mL | ORAL_SOLUTION | Freq: Four times a day (QID) | ORAL | 0 refills | Status: AC | PRN

## 2017-11-18 MED ORDER — AMOXICILLIN-POT CLAVULANATE 875-125 MG PO TABS: 1.0000 | ORAL_TABLET | Freq: Two times a day (BID) | ORAL | 0 refills | Status: DC

## 2017-11-18 NOTE — Progress Notes (Signed)
Subjective:     Patient ID: John Baxter, male   DOB: 1951-06-06, 67 y.o.   MRN: [id number] Chief Complaint  Patient presents with  . Cough    Patient comes in office today with complaints of cough and congestion since 11/12/17. Patient reports episodes of night sweats, cough, congestion, runny nose, sore throat and hoarsnees. Patient has tried otc Mucinex and Mucinex Cough   HPI Reports persistent purulent sinus congestion but no sinus pressure or fever. Remains on MTX for R.A. Review of Systems     Objective:   Physical Exam  Constitutional: He appears well-developed and well-nourished. No distress.  Ears: T.M's intact without inflammation Sinuses: non-tender Throat: no tonsillar enlargement or exudate Neck: left anterior cervical tenderness Lungs: clear     Assessment:    1. Acute non-recurrent sinusitis, unspecified location - amoxicillin-clavulanate (AUGMENTIN) 875-125 MG tablet; Take 1 tablet by mouth 2 (two) times daily.  Dispense: 20 tablet; Refill: 0 - HYDROcodone-homatropine (HYCODAN) 5-1.5 MG/5ML syrup; Take 5 mLs by mouth every 6 (six) hours as needed for up to 5 days. 5 ml 4-6 hours as needed for cough  Dispense: 100 mL; Refill: 0    Plan:    Discussed Mucinex and Delsym.

## 2017-11-18 NOTE — Patient Instructions (Signed)
Continue Mucinex and Delsym for cough.

## 2017-12-20 DIAGNOSIS — M0579 Rheumatoid arthritis with rheumatoid factor of multiple sites without organ or systems involvement: Secondary | ICD-10-CM | POA: Diagnosis not present

## 2017-12-27 DIAGNOSIS — Z79899 Other long term (current) drug therapy: Secondary | ICD-10-CM | POA: Diagnosis not present

## 2017-12-27 DIAGNOSIS — M25562 Pain in left knee: Secondary | ICD-10-CM | POA: Diagnosis not present

## 2017-12-27 DIAGNOSIS — M1711 Unilateral primary osteoarthritis, right knee: Secondary | ICD-10-CM | POA: Diagnosis not present

## 2017-12-27 DIAGNOSIS — M0579 Rheumatoid arthritis with rheumatoid factor of multiple sites without organ or systems involvement: Secondary | ICD-10-CM | POA: Diagnosis not present

## 2017-12-27 DIAGNOSIS — M25561 Pain in right knee: Secondary | ICD-10-CM | POA: Diagnosis not present

## 2018-01-12 DIAGNOSIS — L570 Actinic keratosis: Secondary | ICD-10-CM | POA: Diagnosis not present

## 2018-01-12 DIAGNOSIS — L578 Other skin changes due to chronic exposure to nonionizing radiation: Secondary | ICD-10-CM | POA: Diagnosis not present

## 2018-01-26 DIAGNOSIS — H2512 Age-related nuclear cataract, left eye: Secondary | ICD-10-CM | POA: Diagnosis not present

## 2018-02-16 DIAGNOSIS — H04123 Dry eye syndrome of bilateral lacrimal glands: Secondary | ICD-10-CM | POA: Diagnosis not present

## 2018-02-25 DIAGNOSIS — H18832 Recurrent erosion of cornea, left eye: Secondary | ICD-10-CM | POA: Diagnosis not present

## 2018-02-28 DIAGNOSIS — H16002 Unspecified corneal ulcer, left eye: Secondary | ICD-10-CM | POA: Diagnosis not present

## 2018-03-01 DIAGNOSIS — M0579 Rheumatoid arthritis with rheumatoid factor of multiple sites without organ or systems involvement: Secondary | ICD-10-CM | POA: Diagnosis not present

## 2018-03-01 DIAGNOSIS — M79642 Pain in left hand: Secondary | ICD-10-CM | POA: Diagnosis not present

## 2018-03-01 DIAGNOSIS — H16002 Unspecified corneal ulcer, left eye: Secondary | ICD-10-CM | POA: Diagnosis not present

## 2018-03-01 DIAGNOSIS — Z79899 Other long term (current) drug therapy: Secondary | ICD-10-CM | POA: Diagnosis not present

## 2018-03-02 DIAGNOSIS — H16002 Unspecified corneal ulcer, left eye: Secondary | ICD-10-CM | POA: Diagnosis not present

## 2018-03-07 DIAGNOSIS — B0059 Other herpesviral disease of eye: Secondary | ICD-10-CM | POA: Diagnosis not present

## 2018-03-09 DIAGNOSIS — I340 Nonrheumatic mitral (valve) insufficiency: Secondary | ICD-10-CM | POA: Diagnosis not present

## 2018-03-09 DIAGNOSIS — I482 Chronic atrial fibrillation: Secondary | ICD-10-CM | POA: Diagnosis not present

## 2018-03-09 DIAGNOSIS — E782 Mixed hyperlipidemia: Secondary | ICD-10-CM | POA: Diagnosis not present

## 2018-03-09 DIAGNOSIS — G4733 Obstructive sleep apnea (adult) (pediatric): Secondary | ICD-10-CM | POA: Diagnosis not present

## 2018-03-14 DIAGNOSIS — H16002 Unspecified corneal ulcer, left eye: Secondary | ICD-10-CM | POA: Diagnosis not present

## 2018-03-18 DIAGNOSIS — H16003 Unspecified corneal ulcer, bilateral: Secondary | ICD-10-CM | POA: Diagnosis not present

## 2018-03-18 DIAGNOSIS — H16122 Filamentary keratitis, left eye: Secondary | ICD-10-CM | POA: Diagnosis not present

## 2018-03-18 DIAGNOSIS — H2513 Age-related nuclear cataract, bilateral: Secondary | ICD-10-CM | POA: Diagnosis not present

## 2018-03-25 DIAGNOSIS — M0579 Rheumatoid arthritis with rheumatoid factor of multiple sites without organ or systems involvement: Secondary | ICD-10-CM | POA: Diagnosis not present

## 2018-03-25 DIAGNOSIS — Z79899 Other long term (current) drug therapy: Secondary | ICD-10-CM | POA: Diagnosis not present

## 2018-04-04 DIAGNOSIS — Z79899 Other long term (current) drug therapy: Secondary | ICD-10-CM | POA: Diagnosis not present

## 2018-04-04 DIAGNOSIS — H16002 Unspecified corneal ulcer, left eye: Secondary | ICD-10-CM | POA: Diagnosis not present

## 2018-04-04 DIAGNOSIS — M0579 Rheumatoid arthritis with rheumatoid factor of multiple sites without organ or systems involvement: Secondary | ICD-10-CM | POA: Diagnosis not present

## 2018-04-06 ENCOUNTER — Encounter: Payer: Self-pay | Admitting: Family Medicine

## 2018-04-06 ENCOUNTER — Ambulatory Visit: Payer: BLUE CROSS/BLUE SHIELD | Admitting: Family Medicine

## 2018-04-06 VITALS — BP 116/68 | HR 85 | Temp 98.4°F | Wt 202.0 lb

## 2018-04-06 DIAGNOSIS — E039 Hypothyroidism, unspecified: Secondary | ICD-10-CM | POA: Diagnosis not present

## 2018-04-06 DIAGNOSIS — M069 Rheumatoid arthritis, unspecified: Secondary | ICD-10-CM | POA: Diagnosis not present

## 2018-04-06 NOTE — Progress Notes (Signed)
Patient: John Baxter Male    DOB: Feb 11, 1951   67 y.o.   MRN: [id number] Visit Date: 04/06/2018  Today's Provider: [name], MD   Chief Complaint  Patient presents with  . Hypothyroidism   Subjective:    Thyroid Problem  Presents for follow-up visit. Symptoms include visual change (Pt seeing an eye doctor). Patient reports no anxiety, cold intolerance, diaphoresis, diarrhea, dry skin, fatigue, hair loss, heat intolerance, leg swelling, nail problem, palpitations, weight gain or weight loss. The symptoms have been stable.       No Known Allergies   Current Outpatient Medications:  .  aspirin (ASPIRIN ADULT LOW STRENGTH) 81 MG chewable tablet, Chew by mouth., Disp: , Rfl:  .  folic acid (FOLVITE) 1 MG tablet, Take by mouth., Disp: , Rfl:  .  methotrexate 2.5 MG tablet, Take by mouth. Takes 8 tablets once a week, Disp: , Rfl:  .  metoprolol succinate (TOPROL XL) 25 MG 24 hr tablet, Take 25 mg by mouth daily. , Disp: , Rfl:  .  mometasone (ELOCON) 0.1 % cream, APP 1 APPLINCATION ON THE AFFECTED NECK SKIN AREA QD UP TO 5 DAYS PER WK UNTIL CLEAR UTD, Disp: , Rfl: 1 .  amoxicillin-clavulanate (AUGMENTIN) 875-125 MG tablet, Take 1 tablet by mouth 2 (two) times daily., Disp: 20 tablet, Rfl: 0 .  levothyroxine (SYNTHROID, LEVOTHROID) 100 MCG tablet, Take 1 tablet (100 mcg total) by mouth daily., Disp: 90 tablet, Rfl: 3  Review of Systems  Constitutional: Negative.  Negative for diaphoresis, fatigue, weight gain and weight loss.  HENT: Negative.   Eyes: Negative.   Respiratory: Negative.   Cardiovascular: Negative.  Negative for palpitations.  Gastrointestinal: Negative.  Negative for diarrhea.  Endocrine: Negative.  Negative for cold intolerance and heat intolerance.  Allergic/Immunologic: Negative.   Neurological: Negative for dizziness, light-headedness and headaches.  Psychiatric/Behavioral: Negative.  The patient is not nervous/anxious.     Social History     Tobacco Use  . Smoking status: Never Smoker  . Smokeless tobacco: Never Used  Substance Use Topics  . Alcohol use: Yes    Comment: occasionally 3 beers a week   Objective:   BP 116/68 (BP Location: Right Arm, Patient Position: Sitting, Cuff Size: Normal)   Pulse 85   Temp 98.4 F (36.9 C) (Oral)   Wt 202 lb (91.6 kg)   SpO2 96%   BMI 29.40 kg/m  Vitals:   04/06/18 0911  BP: 116/68  Pulse: 85  Temp: 98.4 F (36.9 C)  TempSrc: Oral  SpO2: 96%  Weight: 202 lb (91.6 kg)     Physical Exam  Constitutional: He is oriented to person, place, and time. He appears well-developed and well-nourished.  HENT:  Head: Normocephalic and atraumatic.  Right Ear: External ear normal.  Left Ear: External ear normal.  Nose: Nose normal.  Eyes: Conjunctivae are normal. No scleral icterus.  Neck: No thyromegaly present.  Cardiovascular: Normal rate, regular rhythm and normal heart sounds.  Pulmonary/Chest: Effort normal and breath sounds normal.  Abdominal: Soft.  Musculoskeletal: He exhibits no edema.  Neurological: He is alert and oriented to person, place, and time.  Skin: Skin is warm and dry.  Psychiatric: He has a normal mood and affect. His behavior is normal. Judgment and thought content normal.        Assessment & Plan:     1. Adult hypothyroidism  - TSH 2.Rheumatoid Arthritis     I have done  the exam and reviewed the above chart and it is accurate to the best of my knowledge. Development worker, community has been used in this note in any air is in the dictation or transcription are unintentional.  [name], MD  [place]

## 2018-04-20 ENCOUNTER — Ambulatory Visit: Payer: BLUE CROSS/BLUE SHIELD | Admitting: Family Medicine

## 2018-04-20 ENCOUNTER — Ambulatory Visit
Admission: RE | Admit: 2018-04-20 | Discharge: 2018-04-20 | Disposition: A | Discharge status: Home / Self Care | Payer: BLUE CROSS/BLUE SHIELD | Source: Ambulatory Visit | Attending: Family Medicine | Admitting: Family Medicine

## 2018-04-20 ENCOUNTER — Encounter: Payer: Self-pay | Admitting: Family Medicine

## 2018-04-20 VITALS — BP 112/68 | HR 96 | Temp 98.4°F | Resp 16 | Wt 204.0 lb

## 2018-04-20 DIAGNOSIS — R509 Fever, unspecified: Secondary | ICD-10-CM

## 2018-04-20 NOTE — Progress Notes (Signed)
Patient: John Baxter Male    DOB: [birth year]   67 y.o.   MRN: [id number] Visit Date: 04/20/2018  Today's Provider: [name], MD   Chief Complaint  Patient presents with  . Night Sweats   Subjective:    HPI Patient comes in today c/o night sweats. He reports that he experiences this more than half of the nights per week. Patient also mentions that his temp was up to 101 about 3 days ago. He denies body aches, fever, or congestion. This morning he says his bed sheets were soaked around him.    No Known Allergies   Current Outpatient Medications:  .  aspirin (ASPIRIN ADULT LOW STRENGTH) 81 MG chewable tablet, Chew by mouth., Disp: , Rfl:  .  folic acid (FOLVITE) 1 MG tablet, Take by mouth., Disp: , Rfl:  .  levothyroxine (SYNTHROID, LEVOTHROID) 100 MCG tablet, Take 1 tablet (100 mcg total) by mouth daily., Disp: 90 tablet, Rfl: 3 .  methotrexate 2.5 MG tablet, Take by mouth. Takes 8 tablets once a week, Disp: , Rfl:  .  metoprolol succinate (TOPROL XL) 25 MG 24 hr tablet, Take 25 mg by mouth daily. , Disp: , Rfl:  .  mometasone (ELOCON) 0.1 % cream, APP 1 APPLINCATION ON THE AFFECTED NECK SKIN AREA QD UP TO 5 DAYS PER WK UNTIL CLEAR UTD, Disp: , Rfl: 1 .  amoxicillin-clavulanate (AUGMENTIN) 875-125 MG tablet, Take 1 tablet by mouth 2 (two) times daily. (Patient not taking: Reported on 04/20/2018), Disp: 20 tablet, Rfl: 0  Review of Systems  Constitutional: Positive for activity change and fatigue. Negative for appetite change and chills.  HENT: Negative for congestion and postnasal drip.   Eyes: Negative.   Respiratory: Negative for cough and shortness of breath.   Cardiovascular: Negative for chest pain, palpitations and leg swelling.  Gastrointestinal: Negative for diarrhea and nausea.  Endocrine: Negative for cold intolerance, heat intolerance, polyphagia and polyuria.  Musculoskeletal: Negative.   Allergic/Immunologic: Negative for environmental allergies,  food allergies and immunocompromised state.  Neurological: Negative for headaches.  Psychiatric/Behavioral: Negative.     Social History   Tobacco Use  . Smoking status: Never Smoker  . Smokeless tobacco: Never Used  Substance Use Topics  . Alcohol use: Yes    Comment: occasionally 3 beers a week   Objective:   BP 112/68 (BP Location: Left Arm, Patient Position: Sitting, Cuff Size: Normal)   Pulse 96   Temp 98.4 F (36.9 C)   Resp 16   Wt 204 lb (92.5 kg)   SpO2 98%   BMI 29.69 kg/m  Vitals:   04/20/18 1356  BP: 112/68  Pulse: 96  Resp: 16  Temp: 98.4 F (36.9 C)  SpO2: 98%  Weight: 204 lb (92.5 kg)     Physical Exam  Constitutional: He is oriented to person, place, and time. He appears well-developed and well-nourished.  HENT:  Head: Normocephalic and atraumatic.  Right Ear: External ear normal.  Left Ear: External ear normal.  Nose: Nose normal.  Eyes: Conjunctivae are normal. No scleral icterus.  Neck: No thyromegaly present.  Cardiovascular: Normal rate, regular rhythm and normal heart sounds.  Pulmonary/Chest: Effort normal and breath sounds normal.  Abdominal: Soft.  Musculoskeletal: He exhibits no edema.  Neurological: He is alert and oriented to person, place, and time.  Skin: Skin is warm and dry.  Psychiatric: He has a normal mood and affect. His behavior is normal. Judgment and thought  content normal.  Cold sore left upper lift.      Assessment & Plan:     1. Fever, unspecified fever cause RTC 1 week. May need CT scans.No sign of bacterial illness. - CBC with Differential/Platelet - Comprehensive metabolic panel - TSH - QuantiFERON-TB Gold Plus - Urine Culture - Urinalysis, Routine w reflex microscopic - DG Chest 2 View; Future 2.RA discusss with Dr Jefm Bryant.      I have done the exam and reviewed the above chart and it is accurate to the best of my knowledge. Development worker, community has been used in this note in any air is in the  dictation or transcription are unintentional.  [name], MD  [place]

## 2018-04-21 ENCOUNTER — Other Ambulatory Visit: Payer: Self-pay

## 2018-04-21 ENCOUNTER — Inpatient Hospital Stay: Admit: 2018-04-21 | Payer: Self-pay

## 2018-04-21 MED ORDER — DOXYCYCLINE HYCLATE 100 MG PO TABS: 100.0000 mg | ORAL_TABLET | Freq: Two times a day (BID) | ORAL | 0 refills | Status: DC

## 2018-04-21 NOTE — Telephone Encounter (Signed)
-----   Message from Jerrol Banana., MD sent at 04/21/2018  9:05 AM EDT ----- Labs ok--pt might have walking pneumonia on CXR--cover with Doxycycline--be aware of sun exposure while on med.

## 2018-04-21 NOTE — Telephone Encounter (Signed)
Patient advised as below. Patient verbalizes understanding and is in agreement with treatment plan.  

## 2018-04-21 NOTE — Telephone Encounter (Signed)
[email or web address]

## 2018-04-22 LAB — URINE CULTURE: ORGANISM ID, BACTERIA: NO GROWTH

## 2018-04-23 LAB — CBC WITH DIFFERENTIAL/PLATELET
BASOS ABS: 0 10*3/uL (ref 0.0–0.2)
Basos: 1 %
EOS (ABSOLUTE): 0.1 10*3/uL (ref 0.0–0.4)
Eos: 1 %
HEMATOCRIT: 37 % — AB (ref 37.5–51.0)
Hemoglobin: 12.8 g/dL — ABNORMAL LOW (ref 13.0–17.7)
Immature Grans (Abs): 0 10*3/uL (ref 0.0–0.1)
Immature Granulocytes: 0 %
LYMPHS ABS: 1.4 10*3/uL (ref 0.7–3.1)
Lymphs: 23 %
MCH: 31.1 pg (ref 26.6–33.0)
MCHC: 34.6 g/dL (ref 31.5–35.7)
MCV: 90 fL (ref 79–97)
MONOS ABS: 0.6 10*3/uL (ref 0.1–0.9)
Monocytes: 9 %
NEUTROS ABS: 4.1 10*3/uL (ref 1.4–7.0)
Neutrophils: 66 %
Platelets: 328 10*3/uL (ref 150–450)
RBC: 4.11 x10E6/uL — ABNORMAL LOW (ref 4.14–5.80)
RDW: 15.1 % (ref 12.3–15.4)
WBC: 6.2 10*3/uL (ref 3.4–10.8)

## 2018-04-23 LAB — URINALYSIS, ROUTINE W REFLEX MICROSCOPIC
Bilirubin, UA: NEGATIVE
Glucose, UA: NEGATIVE
Ketones, UA: NEGATIVE
LEUKOCYTES UA: NEGATIVE
Nitrite, UA: NEGATIVE
PH UA: 5.5 (ref 5.0–7.5)
Protein, UA: NEGATIVE
RBC UA: NEGATIVE
Specific Gravity, UA: 1.014 (ref 1.005–1.030)
Urobilinogen, Ur: 0.2 mg/dL (ref 0.2–1.0)

## 2018-04-23 LAB — COMPREHENSIVE METABOLIC PANEL
ALBUMIN: 3.6 g/dL (ref 3.6–4.8)
ALK PHOS: 58 IU/L (ref 39–117)
ALT: 25 IU/L (ref 0–44)
AST: 26 IU/L (ref 0–40)
Albumin/Globulin Ratio: 1.1 — ABNORMAL LOW (ref 1.2–2.2)
BILIRUBIN TOTAL: 0.7 mg/dL (ref 0.0–1.2)
BUN / CREAT RATIO: 15 (ref 10–24)
BUN: 16 mg/dL (ref 8–27)
CHLORIDE: 103 mmol/L (ref 96–106)
CO2: 23 mmol/L (ref 20–29)
Calcium: 8.8 mg/dL (ref 8.6–10.2)
Creatinine, Ser: 1.06 mg/dL (ref 0.76–1.27)
GFR calc Af Amer: 84 mL/min/{1.73_m2} (ref >59)
GFR calc non Af Amer: 72 mL/min/{1.73_m2} (ref >59)
GLOBULIN, TOTAL: 3.2 g/dL (ref 1.5–4.5)
GLUCOSE: 118 mg/dL — AB (ref 65–99)
Potassium: 4 mmol/L (ref 3.5–5.2)
SODIUM: 140 mmol/L (ref 134–144)
Total Protein: 6.8 g/dL (ref 6.0–8.5)

## 2018-04-23 LAB — QUANTIFERON-TB GOLD PLUS
QUANTIFERON NIL VALUE: 0 [IU]/mL
QUANTIFERON TB2 AG VALUE: 0 [IU]/mL
QUANTIFERON-TB GOLD PLUS: NEGATIVE
QuantiFERON TB1 Ag Value: 0 IU/mL

## 2018-04-23 LAB — TSH: TSH: 2.63 u[IU]/mL (ref 0.450–4.500)

## 2018-05-04 ENCOUNTER — Ambulatory Visit: Payer: BLUE CROSS/BLUE SHIELD | Admitting: Family Medicine

## 2018-05-04 VITALS — BP 104/60 | HR 82 | Temp 97.6°F | Resp 16 | Wt 200.0 lb

## 2018-05-04 DIAGNOSIS — I4891 Unspecified atrial fibrillation: Secondary | ICD-10-CM

## 2018-05-04 DIAGNOSIS — M069 Rheumatoid arthritis, unspecified: Secondary | ICD-10-CM

## 2018-05-04 NOTE — Progress Notes (Signed)
John Baxter  MRN: [id number] DOB: May 12, 1951  Subjective:  HPI   The patient is a 67 year old male who presents for follow up.  He was last seen on 04/20/18 for fever with unknown cause.  He had urine culture which was negative, Met C was esentially normal, TSH normal, Quantiferon negative, CBC has slightly low RBC, HGB and HCT.  Chest x-ray revealed the [atient may have walking pneumonia and Doxycycline was prescribed. The patient states he finished the antibiotic about 6 days ago and reports that he is much better.    Patient Active Problem List   Diagnosis Date Noted  . Allergic rhinitis 06/21/2015  . Cardiac dysrhythmia 06/21/2015  . Atrial fibrillation ([place]) 06/21/2015  . Adult hypothyroidism 06/21/2015  . Arthritis, degenerative 06/21/2015  . OP (osteoporosis) 06/21/2015  . Rheumatoid arthritis ([place]) 06/21/2015  . Apnea, sleep 06/21/2015    No past medical history on file.  Social History   Socioeconomic History  . Marital status: Married    Spouse name: Not on file  . Number of children: 0  . Years of education: Not on file  . Highest education level: Not on file  Occupational History    Comment: Pastoral care pastor  Social Needs  . Financial resource strain: Not on file  . Food insecurity:    Worry: Not on file    Inability: Not on file  . Transportation needs:    Medical: Not on file    Non-medical: Not on file  Tobacco Use  . Smoking status: Never Smoker  . Smokeless tobacco: Never Used  Substance and Sexual Activity  . Alcohol use: Yes    Comment: occasionally 3 beers a week  . Drug use: No  . Sexual activity: Not on file  Lifestyle  . Physical activity:    Days per week: Not on file    Minutes per session: Not on file  . Stress: Not on file  Relationships  . Social connections:    Talks on phone: Not on file    Gets together: Not on file    Attends religious service: Not on file    Active member of club or organization: Not on file   Attends meetings of clubs or organizations: Not on file    Relationship status: Not on file  . Intimate partner violence:    Fear of current or ex partner: Not on file    Emotionally abused: Not on file    Physically abused: Not on file    Forced sexual activity: Not on file  Other Topics Concern  . Not on file  Social History Narrative  . Not on file    Outpatient Encounter Medications as of 05/04/2018  Medication Sig Note  . aspirin (ASPIRIN ADULT LOW STRENGTH) 81 MG chewable tablet Chew by mouth. 06/21/2015: Received from: [facility]  . folic acid (FOLVITE) 1 MG tablet Take by mouth. 06/21/2015: Received from: [facility]  . levothyroxine (SYNTHROID, LEVOTHROID) 100 MCG tablet Take 1 tablet (100 mcg total) by mouth daily.   . methotrexate 2.5 MG tablet Take by mouth. Takes 8 tablets once a week 06/21/2015: Received from: [facility]  . metoprolol succinate (TOPROL XL) 25 MG 24 hr tablet Take 25 mg by mouth daily.  06/21/2015: Received from: [facility]  . mometasone (ELOCON) 0.1 % cream APP 1 APPLINCATION ON THE AFFECTED NECK SKIN AREA QD UP TO 5 DAYS PER WK UNTIL CLEAR UTD   . [  DISCONTINUED] amoxicillin-clavulanate (AUGMENTIN) 875-125 MG tablet Take 1 tablet by mouth 2 (two) times daily. (Patient not taking: Reported on 04/20/2018)   . [DISCONTINUED] doxycycline (VIBRA-TABS) 100 MG tablet Take 1 tablet (100 mg total) by mouth 2 (two) times daily.    No facility-administered encounter medications on file as of 05/04/2018.     No Known Allergies  Review of Systems  Constitutional: Negative for fever and malaise/fatigue.  Eyes:       Eyes improving.  Respiratory: Negative for cough, shortness of breath and wheezing.   Cardiovascular: Negative.   Gastrointestinal: Negative.   Genitourinary: Negative.   Neurological: Negative.   Endo/Heme/Allergies: Negative.   Psychiatric/Behavioral: Negative.     Objective:    BP 104/60 (BP Location: Right Arm, Patient Position: Sitting, Cuff Size: Normal)   Pulse 82   Temp 97.6 F (36.4 C) (Oral)   Resp 16   Wt 200 lb (90.7 kg)   SpO2 97%   BMI 29.11 kg/m   Physical Exam  Constitutional: He is oriented to person, place, and time and well-developed, well-nourished, and in no distress.  HENT:  Head: Normocephalic and atraumatic.  Right Ear: External ear normal.  Left Ear: External ear normal.  Nose: Nose normal.  Eyes: Conjunctivae are normal. No scleral icterus.  Neck: No thyromegaly present.  Cardiovascular: Normal rate, regular rhythm and normal heart sounds.  Pulmonary/Chest: Effort normal and breath sounds normal.  Musculoskeletal: He exhibits no edema.  Neurological: He is alert and oriented to person, place, and time. Gait normal. GCS score is 15.  Skin: Skin is warm and dry.  Psychiatric: Mood, memory, affect and judgment normal.    Assessment and Plan :  Pneumonia Clinically better--repeat CXR November. Rheumatoid Arthritis Eye exam soon at [facility].  I have done the exam and reviewed the chart and it is accurate to the best of my knowledge. Development worker, community has been used and  any errors in dictation or transcription are unintentional. Miguel Aschoff M.D. [facility]

## 2018-05-10 DIAGNOSIS — H16003 Unspecified corneal ulcer, bilateral: Secondary | ICD-10-CM | POA: Diagnosis not present

## 2018-05-10 DIAGNOSIS — H02883 Meibomian gland dysfunction of right eye, unspecified eyelid: Secondary | ICD-10-CM | POA: Diagnosis not present

## 2018-05-10 DIAGNOSIS — H16122 Filamentary keratitis, left eye: Secondary | ICD-10-CM | POA: Diagnosis not present

## 2018-05-10 DIAGNOSIS — H2513 Age-related nuclear cataract, bilateral: Secondary | ICD-10-CM | POA: Diagnosis not present

## 2018-06-02 ENCOUNTER — Ambulatory Visit (INDEPENDENT_AMBULATORY_CARE_PROVIDER_SITE_OTHER): Payer: BLUE CROSS/BLUE SHIELD

## 2018-06-02 DIAGNOSIS — Z23 Encounter for immunization: Secondary | ICD-10-CM

## 2018-06-03 DIAGNOSIS — Z79899 Other long term (current) drug therapy: Secondary | ICD-10-CM | POA: Diagnosis not present

## 2018-06-03 DIAGNOSIS — M0579 Rheumatoid arthritis with rheumatoid factor of multiple sites without organ or systems involvement: Secondary | ICD-10-CM | POA: Diagnosis not present

## 2018-06-07 DIAGNOSIS — M7061 Trochanteric bursitis, right hip: Secondary | ICD-10-CM | POA: Diagnosis not present

## 2018-06-07 DIAGNOSIS — M0579 Rheumatoid arthritis with rheumatoid factor of multiple sites without organ or systems involvement: Secondary | ICD-10-CM | POA: Diagnosis not present

## 2018-06-07 DIAGNOSIS — Z79899 Other long term (current) drug therapy: Secondary | ICD-10-CM | POA: Diagnosis not present

## 2018-06-07 DIAGNOSIS — H16002 Unspecified corneal ulcer, left eye: Secondary | ICD-10-CM | POA: Diagnosis not present

## 2018-06-14 DIAGNOSIS — H2511 Age-related nuclear cataract, right eye: Secondary | ICD-10-CM | POA: Diagnosis not present

## 2018-06-14 DIAGNOSIS — H16122 Filamentary keratitis, left eye: Secondary | ICD-10-CM | POA: Diagnosis not present

## 2018-06-14 DIAGNOSIS — M0579 Rheumatoid arthritis with rheumatoid factor of multiple sites without organ or systems involvement: Secondary | ICD-10-CM | POA: Diagnosis not present

## 2018-06-14 DIAGNOSIS — H16003 Unspecified corneal ulcer, bilateral: Secondary | ICD-10-CM | POA: Diagnosis not present

## 2018-06-28 DIAGNOSIS — H2513 Age-related nuclear cataract, bilateral: Secondary | ICD-10-CM | POA: Diagnosis not present

## 2018-06-28 DIAGNOSIS — H16003 Unspecified corneal ulcer, bilateral: Secondary | ICD-10-CM | POA: Diagnosis not present

## 2018-06-28 DIAGNOSIS — H16122 Filamentary keratitis, left eye: Secondary | ICD-10-CM | POA: Diagnosis not present

## 2018-06-28 DIAGNOSIS — M069 Rheumatoid arthritis, unspecified: Secondary | ICD-10-CM | POA: Diagnosis not present

## 2018-07-05 DIAGNOSIS — H2513 Age-related nuclear cataract, bilateral: Secondary | ICD-10-CM | POA: Diagnosis not present

## 2018-07-05 DIAGNOSIS — M069 Rheumatoid arthritis, unspecified: Secondary | ICD-10-CM | POA: Diagnosis not present

## 2018-07-05 DIAGNOSIS — H16003 Unspecified corneal ulcer, bilateral: Secondary | ICD-10-CM | POA: Diagnosis not present

## 2018-07-05 DIAGNOSIS — H16122 Filamentary keratitis, left eye: Secondary | ICD-10-CM | POA: Diagnosis not present

## 2018-07-07 DIAGNOSIS — B0053 Herpesviral conjunctivitis: Secondary | ICD-10-CM | POA: Diagnosis not present

## 2018-07-07 DIAGNOSIS — H18899 Other specified disorders of cornea, unspecified eye: Secondary | ICD-10-CM | POA: Diagnosis not present

## 2018-07-07 DIAGNOSIS — E039 Hypothyroidism, unspecified: Secondary | ICD-10-CM | POA: Diagnosis not present

## 2018-07-07 DIAGNOSIS — H16042 Marginal corneal ulcer, left eye: Secondary | ICD-10-CM | POA: Diagnosis not present

## 2018-07-07 DIAGNOSIS — Z79899 Other long term (current) drug therapy: Secondary | ICD-10-CM | POA: Diagnosis not present

## 2018-07-07 DIAGNOSIS — G4733 Obstructive sleep apnea (adult) (pediatric): Secondary | ICD-10-CM | POA: Diagnosis not present

## 2018-07-07 DIAGNOSIS — H18892 Other specified disorders of cornea, left eye: Secondary | ICD-10-CM | POA: Diagnosis not present

## 2018-07-07 DIAGNOSIS — Z87891 Personal history of nicotine dependence: Secondary | ICD-10-CM | POA: Diagnosis not present

## 2018-07-07 DIAGNOSIS — I482 Chronic atrial fibrillation, unspecified: Secondary | ICD-10-CM | POA: Diagnosis not present

## 2018-07-07 DIAGNOSIS — H16002 Unspecified corneal ulcer, left eye: Secondary | ICD-10-CM | POA: Diagnosis not present

## 2018-07-07 DIAGNOSIS — E782 Mixed hyperlipidemia: Secondary | ICD-10-CM | POA: Diagnosis not present

## 2018-07-07 DIAGNOSIS — I4891 Unspecified atrial fibrillation: Secondary | ICD-10-CM | POA: Diagnosis not present

## 2018-08-23 DIAGNOSIS — H18892 Other specified disorders of cornea, left eye: Secondary | ICD-10-CM | POA: Diagnosis not present

## 2018-08-23 DIAGNOSIS — H16122 Filamentary keratitis, left eye: Secondary | ICD-10-CM | POA: Diagnosis not present

## 2018-08-23 DIAGNOSIS — H16002 Unspecified corneal ulcer, left eye: Secondary | ICD-10-CM | POA: Diagnosis not present

## 2018-08-23 DIAGNOSIS — H2511 Age-related nuclear cataract, right eye: Secondary | ICD-10-CM | POA: Diagnosis not present

## 2018-09-02 ENCOUNTER — Other Ambulatory Visit: Payer: Self-pay | Admitting: Family Medicine

## 2018-09-02 DIAGNOSIS — M0579 Rheumatoid arthritis with rheumatoid factor of multiple sites without organ or systems involvement: Secondary | ICD-10-CM | POA: Diagnosis not present

## 2018-09-02 DIAGNOSIS — Z79899 Other long term (current) drug therapy: Secondary | ICD-10-CM | POA: Diagnosis not present

## 2018-09-02 NOTE — Telephone Encounter (Signed)
Pharmacy requesting refills. Thanks!  

## 2018-09-09 DIAGNOSIS — I482 Chronic atrial fibrillation, unspecified: Secondary | ICD-10-CM | POA: Diagnosis not present

## 2018-09-09 DIAGNOSIS — G4733 Obstructive sleep apnea (adult) (pediatric): Secondary | ICD-10-CM | POA: Diagnosis not present

## 2018-09-13 DIAGNOSIS — B0052 Herpesviral keratitis: Secondary | ICD-10-CM | POA: Diagnosis not present

## 2018-09-13 DIAGNOSIS — H18892 Other specified disorders of cornea, left eye: Secondary | ICD-10-CM | POA: Diagnosis not present

## 2018-09-13 DIAGNOSIS — H16002 Unspecified corneal ulcer, left eye: Secondary | ICD-10-CM | POA: Diagnosis not present

## 2018-09-13 DIAGNOSIS — H2511 Age-related nuclear cataract, right eye: Secondary | ICD-10-CM | POA: Diagnosis not present

## 2018-09-26 ENCOUNTER — Ambulatory Visit: Payer: BLUE CROSS/BLUE SHIELD | Admitting: Family Medicine

## 2018-09-26 VITALS — BP 120/74 | HR 84 | Temp 98.1°F | Resp 16 | Wt 213.0 lb

## 2018-09-26 DIAGNOSIS — J40 Bronchitis, not specified as acute or chronic: Secondary | ICD-10-CM

## 2018-09-26 DIAGNOSIS — I4811 Longstanding persistent atrial fibrillation: Secondary | ICD-10-CM

## 2018-09-26 DIAGNOSIS — M069 Rheumatoid arthritis, unspecified: Secondary | ICD-10-CM | POA: Diagnosis not present

## 2018-09-26 DIAGNOSIS — J329 Chronic sinusitis, unspecified: Secondary | ICD-10-CM

## 2018-09-26 MED ORDER — HYDROCOD POLST-CPM POLST ER 10-8 MG/5ML PO SUER: 5.0000 mL | Freq: Every evening | ORAL | 0 refills | Status: DC | PRN

## 2018-09-26 MED ORDER — DOXYCYCLINE HYCLATE 100 MG PO TABS: 100.0000 mg | ORAL_TABLET | Freq: Two times a day (BID) | ORAL | 0 refills | Status: DC

## 2018-09-26 NOTE — Progress Notes (Signed)
John Baxter  MRN: [id number] DOB: September 04, 1950  Subjective:  HPI   The patient is a 68 year old male who presents for evaluation of cough and hoarseness.  He states that this started 4 days ago.  He states it is worse at night and he has been unable to sleep.  He has had minimal sputum production and denies fever.  Patient Active Problem List   Diagnosis Date Noted  . Allergic rhinitis 06/21/2015  . Cardiac dysrhythmia 06/21/2015  . Atrial fibrillation ([place]) 06/21/2015  . Adult hypothyroidism 06/21/2015  . Arthritis, degenerative 06/21/2015  . OP (osteoporosis) 06/21/2015  . Rheumatoid arthritis ([place]) 06/21/2015  . Apnea, sleep 06/21/2015    No past medical history on file.  Social History   Socioeconomic History  . Marital status: Married    Spouse name: Not on file  . Number of children: 0  . Years of education: Not on file  . Highest education level: Not on file  Occupational History    Comment: Pastoral care pastor  Social Needs  . Financial resource strain: Not on file  . Food insecurity:    Worry: Not on file    Inability: Not on file  . Transportation needs:    Medical: Not on file    Non-medical: Not on file  Tobacco Use  . Smoking status: Never Smoker  . Smokeless tobacco: Never Used  Substance and Sexual Activity  . Alcohol use: Yes    Comment: occasionally 3 beers a week  . Drug use: No  . Sexual activity: Not on file  Lifestyle  . Physical activity:    Days per week: Not on file    Minutes per session: Not on file  . Stress: Not on file  Relationships  . Social connections:    Talks on phone: Not on file    Gets together: Not on file    Attends religious service: Not on file    Active member of club or organization: Not on file    Attends meetings of clubs or organizations: Not on file    Relationship status: Not on file  . Intimate partner violence:    Fear of current or ex partner: Not on file    Emotionally abused: Not on file   Physically abused: Not on file    Forced sexual activity: Not on file  Other Topics Concern  . Not on file  Social History Narrative  . Not on file    Outpatient Encounter Medications as of 09/26/2018  Medication Sig Note  . acyclovir (ZOVIRAX) 400 MG tablet Take 400 mg by mouth 5 (five) times daily.   Marland Kitchen aspirin (ASPIRIN ADULT LOW STRENGTH) 81 MG chewable tablet Chew by mouth. 06/21/2015: Received from: [facility]  . folic acid (FOLVITE) 1 MG tablet Take by mouth. 06/21/2015: Received from: [facility]  . levothyroxine (SYNTHROID, LEVOTHROID) 100 MCG tablet TAKE 1 TABLET BY MOUTH ONCE DAILY   . methotrexate 2.5 MG tablet Take by mouth. Takes 8 tablets once a week 06/21/2015: Received from: [facility]  . metoprolol succinate (TOPROL XL) 25 MG 24 hr tablet Take 25 mg by mouth daily.  06/21/2015: Received from: [facility]  . mometasone (ELOCON) 0.1 % cream APP 1 APPLINCATION ON THE AFFECTED NECK SKIN AREA QD UP TO 5 DAYS PER WK UNTIL CLEAR UTD   . predniSONE (DELTASONE) 2.5 MG tablet Take 2.5 mg by mouth 2 (two) times daily with a meal.   .  chlorpheniramine-HYDROcodone (TUSSIONEX PENNKINETIC ER) 10-8 MG/5ML SUER Take 5 mLs by mouth at bedtime as needed for cough.   . doxycycline (VIBRA-TABS) 100 MG tablet Take 1 tablet (100 mg total) by mouth 2 (two) times daily.    No facility-administered encounter medications on file as of 09/26/2018.     No Known Allergies  Review of Systems  Constitutional: Negative for fever.  HENT: Negative for congestion, ear discharge, ear pain, hearing loss, nosebleeds, sinus pain, sore throat and tinnitus.   Eyes: Negative.   Respiratory: Positive for cough and sputum production (minimal). Negative for shortness of breath and wheezing.   Cardiovascular: Negative for chest pain and palpitations.  Gastrointestinal: Negative.   Endo/Heme/Allergies: Negative.   Psychiatric/Behavioral: Negative.      Objective:  BP 120/74 (BP Location: Right Arm, Patient Position: Sitting, Cuff Size: Normal)   Pulse 84   Temp 98.1 F (36.7 C) (Oral)   Resp 16   Wt 213 lb (96.6 kg)   SpO2 98%   BMI 31.00 kg/m   Physical Exam  Constitutional: He is oriented to person, place, and time and well-developed, well-nourished, and in no distress.  HENT:  Head: Normocephalic and atraumatic.  Right Ear: External ear normal.  Left Ear: External ear normal.  Nose: Nose normal.  Mouth/Throat: Oropharynx is clear and moist.  Eyes: Conjunctivae are normal. No scleral icterus.  Neck: No thyromegaly present.  Cardiovascular: Normal rate, regular rhythm and normal heart sounds.  Pulmonary/Chest: Effort normal and breath sounds normal.  Musculoskeletal:        General: No edema.  Lymphadenopathy:    He has no cervical adenopathy.  Neurological: He is alert and oriented to person, place, and time. Gait normal. GCS score is 15.  Skin: Skin is warm and dry.  Psychiatric: Mood, memory, affect and judgment normal.    Assessment and Plan :  Sinusitis, unspecified chronicity, unspecified location - Plan: doxycycline (VIBRA-TABS) 100 MG tablet, chlorpheniramine-HYDROcodone (TUSSIONEX PENNKINETIC ER) 10-8 MG/5ML SUER  Bronchitis - Plan: doxycycline (VIBRA-TABS) 100 MG tablet, chlorpheniramine-HYDROcodone (TUSSIONEX PENNKINETIC ER) 10-8 MG/5ML SUER  1. Sinusitis, unspecified chronicity, unspecified location - symptoms and exam c/w sinusitis   - no evidence of AOM, CAP, strep pharyngitis, or other infection - given duration of symptoms, suspect bacterial etiology - will treat with Doxycycline x10d - discussed symptomatic management (flonase, decongestants, etc), natural course, and return precautions   - doxycycline (VIBRA-TABS) 100 MG tablet; Take 1 tablet (100 mg total) by mouth 2 (two) times daily.  Dispense: 20 tablet; Refill: 0 - chlorpheniramine-HYDROcodone (TUSSIONEX PENNKINETIC ER) 10-8 MG/5ML SUER;  Take 5 mLs by mouth at bedtime as needed for cough.  Dispense: 140 mL; Refill: 0  2. Bronchitis For nighttime cough with given patient prescription for Tussionex. - doxycycline (VIBRA-TABS) 100 MG tablet; Take 1 tablet (100 mg total) by mouth 2 (two) times daily.  Dispense: 20 tablet; Refill: 0 - chlorpheniramine-HYDROcodone (TUSSIONEX PENNKINETIC ER) 10-8 MG/5ML SUER; Take 5 mLs by mouth at bedtime as needed for cough.  Dispense: 140 mL; Refill: 0  Problem List Items Addressed This Visit      Cardiovascular and Mediastinum   Atrial fibrillation ([place])    Chronic Rate controlled Given other comorbidities, decision made with cardiology to avoid anticoagulation       Other Visit Diagnoses    Sinusitis, unspecified chronicity, unspecified location    -  Primary   Relevant Medications   acyclovir (ZOVIRAX) 400 MG tablet   predniSONE (DELTASONE) 2.5 MG tablet  doxycycline (VIBRA-TABS) 100 MG tablet   chlorpheniramine-HYDROcodone (TUSSIONEX PENNKINETIC ER) 10-8 MG/5ML SUER   Bronchitis       Relevant Medications   doxycycline (VIBRA-TABS) 100 MG tablet   chlorpheniramine-HYDROcodone (TUSSIONEX PENNKINETIC ER) 10-8 MG/5ML SUER       I have done the exam and reviewed the chart and it is accurate to the best of my knowledge. Development worker, community has been used and  any errors in dictation or transcription are unintentional. Miguel Aschoff M.D. [facility]

## 2018-09-26 NOTE — Assessment & Plan Note (Signed)
Chronic Rate controlled Given other comorbidities, decision made with cardiology to avoid anticoagulation

## 2018-09-28 DIAGNOSIS — L821 Other seborrheic keratosis: Secondary | ICD-10-CM | POA: Diagnosis not present

## 2018-09-28 DIAGNOSIS — L738 Other specified follicular disorders: Secondary | ICD-10-CM | POA: Diagnosis not present

## 2018-09-28 DIAGNOSIS — L820 Inflamed seborrheic keratosis: Secondary | ICD-10-CM | POA: Diagnosis not present

## 2018-09-28 DIAGNOSIS — L578 Other skin changes due to chronic exposure to nonionizing radiation: Secondary | ICD-10-CM | POA: Diagnosis not present

## 2018-10-11 DIAGNOSIS — H16002 Unspecified corneal ulcer, left eye: Secondary | ICD-10-CM | POA: Diagnosis not present

## 2018-10-11 DIAGNOSIS — M0579 Rheumatoid arthritis with rheumatoid factor of multiple sites without organ or systems involvement: Secondary | ICD-10-CM | POA: Diagnosis not present

## 2018-10-12 DIAGNOSIS — M069 Rheumatoid arthritis, unspecified: Secondary | ICD-10-CM | POA: Diagnosis not present

## 2018-10-12 DIAGNOSIS — H2511 Age-related nuclear cataract, right eye: Secondary | ICD-10-CM | POA: Diagnosis not present

## 2018-10-12 DIAGNOSIS — H18892 Other specified disorders of cornea, left eye: Secondary | ICD-10-CM | POA: Diagnosis not present

## 2018-10-12 DIAGNOSIS — H16002 Unspecified corneal ulcer, left eye: Secondary | ICD-10-CM | POA: Diagnosis not present

## 2018-10-26 ENCOUNTER — Other Ambulatory Visit: Payer: Self-pay

## 2018-10-26 ENCOUNTER — Ambulatory Visit (INDEPENDENT_AMBULATORY_CARE_PROVIDER_SITE_OTHER): Payer: BLUE CROSS/BLUE SHIELD | Admitting: Family Medicine

## 2018-10-26 VITALS — BP 100/60 | HR 53 | Temp 97.8°F | Resp 16 | Ht 70.0 in | Wt 213.0 lb

## 2018-10-26 DIAGNOSIS — Z125 Encounter for screening for malignant neoplasm of prostate: Secondary | ICD-10-CM | POA: Diagnosis not present

## 2018-10-26 DIAGNOSIS — Z0000 Encounter for general adult medical examination without abnormal findings: Secondary | ICD-10-CM | POA: Diagnosis not present

## 2018-10-26 DIAGNOSIS — M069 Rheumatoid arthritis, unspecified: Secondary | ICD-10-CM

## 2018-10-26 NOTE — Progress Notes (Signed)
Patient: John Baxter, Male    DOB: 05/26/1951, 68 y.o.   MRN: [id number] Visit Date: 10/26/2018  Today's Provider: [name], MD   Chief Complaint  Patient presents with  . Annual Exam   Subjective:  John Baxter is a 68 y.o. male who presents today for health maintenance and complete physical. He feels well. He reports exercising daily. He reports he is sleeping well. 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  He is improving.   Immunization History  Administered Date(s) Administered  . Influenza, High Dose Seasonal PF 06/24/2016, 06/02/2018  . Influenza,inj,Quad PF,6+ Mos 08/14/2015  . Pneumococcal Conjugate-13 08/05/2016  . Tdap 09/11/2008   10/23/15 Colonoscopy, Dr. Jonelle Sports adenoma, no high grade dysplasia or carcinoma seen.  Repeat in 2022  Review of Systems  Constitutional: Negative.   HENT: Negative.   Eyes: Negative.   Respiratory: Positive for apnea.   Cardiovascular: Negative.   Gastrointestinal: Negative.   Endocrine: Negative.   Genitourinary: Negative.        Nocturia x1.  Musculoskeletal: Negative.   Skin: Negative.   Allergic/Immunologic: Negative.   Neurological: Negative.   Hematological: Negative.   Psychiatric/Behavioral: Negative.     Social History   Socioeconomic History  . Marital status: Married    Spouse name: Not on file  . Number of children: 0  . Years of education: Not on file  . Highest education level: Not on file  Occupational History    Comment: Pastoral care pastor  Social Needs  . Financial resource strain: Not on file  . Food insecurity:    Worry: Not on file    Inability: Not on file  . Transportation needs:    Medical: Not on file    Non-medical: Not on file  Tobacco Use  . Smoking status: Never Smoker  . Smokeless tobacco: Never Used  Substance and Sexual Activity  . Alcohol use: Yes    Comment: occasionally 3 beers a week   . Drug use: No  . Sexual activity: Not on file  Lifestyle  . Physical activity:    Days per week: Not on file    Minutes per session: Not on file  . Stress: Not on file  Relationships  . Social connections:    Talks on phone: Not on file    Gets together: Not on file    Attends religious service: Not on file    Active member of club or organization: Not on file    Attends meetings of clubs or organizations: Not on file    Relationship status: Not on file  . Intimate partner violence:    Fear of current or ex partner: Not on file    Emotionally abused: Not on file    Physically abused: Not on file    Forced sexual activity: Not on file  Other Topics Concern  . Not on file  Social History Narrative  . Not on file    Patient Active Problem List   Diagnosis Date Noted  . Allergic rhinitis 06/21/2015  . Cardiac dysrhythmia 06/21/2015  . Atrial fibrillation ([place]) 06/21/2015  . Adult hypothyroidism 06/21/2015  . Arthritis, degenerative 06/21/2015  . OP (osteoporosis) 06/21/2015  . Rheumatoid arthritis ([place]) 06/21/2015  . Apnea, sleep 06/21/2015    Past Surgical History:  Procedure Laterality Date  . TOE SURGERY     Ingrown nail x2    His family history includes Arthritis in his  mother; Blindness (age of onset: 56) in his father; Cancer in his father; Heart disease in his father; Stroke in his mother.     Outpatient Encounter Medications as of 10/26/2018  Medication Sig Note  . acyclovir (ZOVIRAX) 400 MG tablet Take 400 mg by mouth 5 (five) times daily.   Marland Kitchen aspirin (ASPIRIN ADULT LOW STRENGTH) 81 MG chewable tablet Chew by mouth. 06/21/2015: Received from: [facility]  . folic acid (FOLVITE) 1 MG tablet Take by mouth. 06/21/2015: Received from: [facility]  . Ganciclovir (ZIRGAN) 0.15 % GEL Apply to eye.   . levothyroxine (SYNTHROID, LEVOTHROID) 100 MCG tablet TAKE 1 TABLET BY MOUTH ONCE DAILY   . methotrexate 2.5 MG tablet Take 10 mg by  mouth. Takes 8 tablets once a week 06/21/2015: Received from: [facility]  . metoprolol succinate (TOPROL XL) 25 MG 24 hr tablet Take 25 mg by mouth daily.  06/21/2015: Received from: [facility]  . mometasone (ELOCON) 0.1 % cream APP 1 APPLINCATION ON THE AFFECTED NECK SKIN AREA QD UP TO 5 DAYS PER WK UNTIL CLEAR UTD   . OPHTHALMIC IRRIGATION SOLUTION OP Apply to eye. Plasma rich growth factor ophthalmic solution from [facility]   . fluorometholone (FML) 0.1 % ophthalmic suspension SHAKE LQ AND INT 1 GTT IN OS QID   . HUMIRA PEN 40 MG/0.4ML PNKT Inject 1 pen/syringe subcutaneously every other week   . RESTASIS 0.05 % ophthalmic emulsion INT 1 GTT IN OU BID   . [DISCONTINUED] chlorpheniramine-HYDROcodone (TUSSIONEX PENNKINETIC ER) 10-8 MG/5ML SUER Take 5 mLs by mouth at bedtime as needed for cough.   . [DISCONTINUED] doxycycline (VIBRA-TABS) 100 MG tablet Take 1 tablet (100 mg total) by mouth 2 (two) times daily.   . [DISCONTINUED] predniSONE (DELTASONE) 2.5 MG tablet Take 2.5 mg by mouth 2 (two) times daily with a meal.    No facility-administered encounter medications on file as of 10/26/2018.     Patient Care Team: Jerrol Banana., MD as PCP - General (Family Medicine)      Objective:   Vitals:  Vitals:   10/26/18 0917  BP: 100/60  Pulse: (!) 53  Resp: 16  Temp: 97.8 F (36.6 C)  TempSrc: Oral  SpO2: 97%  Weight: 213 lb (96.6 kg)  Height: 5\' 10"  (1.778 m)    Physical Exam Constitutional:      Appearance: Normal appearance. He is normal weight.  HENT:     Head: Normocephalic and atraumatic.     Right Ear: Tympanic membrane, ear canal and external ear normal.     Left Ear: Tympanic membrane, ear canal and external ear normal.     Ears:     Comments: Both EACs blocked with cerumen.    Nose: Nose normal.     Mouth/Throat:     Mouth: Mucous membranes are moist.     Pharynx: Oropharynx is clear.  Eyes:     Extraocular Movements:  Extraocular movements intact.     Conjunctiva/sclera: Conjunctivae normal.     Pupils: Pupils are equal, round, and reactive to light.  Neck:     Musculoskeletal: Normal range of motion and neck supple.  Cardiovascular:     Rate and Rhythm: Normal rate and regular rhythm.     Pulses: Normal pulses.     Heart sounds: Normal heart sounds.  Pulmonary:     Effort: Pulmonary effort is normal.     Breath sounds: Normal breath sounds.  Abdominal:  General: Abdomen is flat. Bowel sounds are normal.     Palpations: Abdomen is soft.  Genitourinary:    Penis: Normal.      Scrotum/Testes: Normal.     Prostate: Normal.     Rectum: Normal.  Musculoskeletal: Normal range of motion.  Skin:    General: Skin is warm and dry.     Comments: Very fair skin.  Neurological:     General: No focal deficit present.     Mental Status: He is alert and oriented to person, place, and time. Mental status is at baseline.  Psychiatric:        Mood and Affect: Mood normal.        Behavior: Behavior normal.        Thought Content: Thought content normal.        Judgment: Judgment normal.      Depression Screen PHQ 2/9 Scores 10/26/2018 10/06/2017 08/05/2016  PHQ - 2 Score 0 0 0  PHQ- 9 Score 0 0 -      Assessment & Plan:     Routine Health Maintenance and Physical Exam  Exercise Activities and Dietary recommendations Goals   None     Immunization History  Administered Date(s) Administered  . Influenza, High Dose Seasonal PF 06/24/2016, 06/02/2018  . Influenza,inj,Quad PF,6+ Mos 08/14/2015  . Pneumococcal Conjugate-13 08/05/2016  . Tdap 09/11/2008    Health Maintenance  Topic Date Due  . Hepatitis C Screening  [birth year]  . PNA vac Low Risk Adult (2 of 2 - PPSV23) 08/05/2017  . TETANUS/TDAP  09/11/2018  . COLONOSCOPY  10/22/2020  . INFLUENZA VACCINE  Completed     Discussed health benefits of physical activity, and encouraged him to engage in regular exercise appropriate for his  age and condition.  1. Annual physical exam  - CBC with Differential/Platelet - Comprehensive metabolic panel - Lipid Panel With LDL/HDL Ratio - TSH  2. Prostate cancer screening  - PSA  3. Rheumatoid arthritis involving both hands, unspecified rheumatoid factor presence ([place])      I have done the exam and reviewed the chart and it is accurate to the best of my knowledge. Development worker, community has been used and  any errors in dictation or transcription are unintentional. Miguel Aschoff M.D. [facility]

## 2018-10-27 ENCOUNTER — Telehealth: Payer: Self-pay

## 2018-10-27 LAB — CBC WITH DIFFERENTIAL/PLATELET
Basophils Absolute: 0 10*3/uL (ref 0.0–0.2)
Basos: 1 %
EOS (ABSOLUTE): 0.2 10*3/uL (ref 0.0–0.4)
Eos: 3 %
Hematocrit: 38.2 % (ref 37.5–51.0)
Hemoglobin: 13.7 g/dL (ref 13.0–17.7)
Immature Grans (Abs): 0 10*3/uL (ref 0.0–0.1)
Immature Granulocytes: 0 %
Lymphocytes Absolute: 1.6 10*3/uL (ref 0.7–3.1)
Lymphs: 30 %
MCH: 32.8 pg (ref 26.6–33.0)
MCHC: 35.9 g/dL — ABNORMAL HIGH (ref 31.5–35.7)
MCV: 91 fL (ref 79–97)
Monocytes Absolute: 0.6 10*3/uL (ref 0.1–0.9)
Monocytes: 12 %
Neutrophils Absolute: 2.9 10*3/uL (ref 1.4–7.0)
Neutrophils: 54 %
Platelets: 235 10*3/uL (ref 150–450)
RBC: 4.18 x10E6/uL (ref 4.14–5.80)
RDW: 13.1 % (ref 11.6–15.4)
WBC: 5.3 10*3/uL (ref 3.4–10.8)

## 2018-10-27 LAB — LIPID PANEL WITH LDL/HDL RATIO
Cholesterol, Total: 168 mg/dL (ref 100–199)
HDL: 49 mg/dL (ref >39)
LDL Calculated: 103 mg/dL — ABNORMAL HIGH (ref 0–99)
LDl/HDL Ratio: 2.1 ratio (ref 0.0–3.6)
Triglycerides: 81 mg/dL (ref 0–149)
VLDL Cholesterol Cal: 16 mg/dL (ref 5–40)

## 2018-10-27 LAB — COMPREHENSIVE METABOLIC PANEL
ALT: 28 IU/L (ref 0–44)
AST: 27 IU/L (ref 0–40)
Albumin/Globulin Ratio: 1.5 (ref 1.2–2.2)
Albumin: 4 g/dL (ref 3.8–4.8)
Alkaline Phosphatase: 51 IU/L (ref 39–117)
BILIRUBIN TOTAL: 1 mg/dL (ref 0.0–1.2)
BUN/Creatinine Ratio: 15 (ref 10–24)
BUN: 16 mg/dL (ref 8–27)
CO2: 23 mmol/L (ref 20–29)
Calcium: 9 mg/dL (ref 8.6–10.2)
Chloride: 104 mmol/L (ref 96–106)
Creatinine, Ser: 1.1 mg/dL (ref 0.76–1.27)
GFR calc Af Amer: 80 mL/min/{1.73_m2} (ref >59)
GFR calc non Af Amer: 69 mL/min/{1.73_m2} (ref >59)
Globulin, Total: 2.6 g/dL (ref 1.5–4.5)
Glucose: 96 mg/dL (ref 65–99)
POTASSIUM: 4.6 mmol/L (ref 3.5–5.2)
Sodium: 141 mmol/L (ref 134–144)
Total Protein: 6.6 g/dL (ref 6.0–8.5)

## 2018-10-27 LAB — PSA: Prostate Specific Ag, Serum: 2.8 ng/mL (ref 0.0–4.0)

## 2018-10-27 LAB — TSH: TSH: 3.16 u[IU]/mL (ref 0.450–4.500)

## 2018-10-27 NOTE — Telephone Encounter (Signed)
Patient is returning your call he ask that you call him back at [phone]. KW

## 2018-10-27 NOTE — Telephone Encounter (Signed)
Pt advised.   Thanks,   -Casen Pryor  

## 2018-10-27 NOTE — Telephone Encounter (Signed)
-----   Message from Jerrol Banana., MD sent at 10/27/2018  1:45 PM EDT ----- Labs good.

## 2018-10-27 NOTE — Telephone Encounter (Signed)
LMTCB 10/27/2018   Thanks,   -Davona Kinoshita  

## 2018-12-30 DIAGNOSIS — H6123 Impacted cerumen, bilateral: Secondary | ICD-10-CM | POA: Diagnosis not present

## 2018-12-30 DIAGNOSIS — H93299 Other abnormal auditory perceptions, unspecified ear: Secondary | ICD-10-CM | POA: Diagnosis not present

## 2019-01-06 DIAGNOSIS — M0579 Rheumatoid arthritis with rheumatoid factor of multiple sites without organ or systems involvement: Secondary | ICD-10-CM | POA: Diagnosis not present

## 2019-01-06 DIAGNOSIS — H16002 Unspecified corneal ulcer, left eye: Secondary | ICD-10-CM | POA: Diagnosis not present

## 2019-01-16 ENCOUNTER — Other Ambulatory Visit: Payer: Self-pay

## 2019-01-16 ENCOUNTER — Telehealth: Payer: Self-pay | Admitting: Family Medicine

## 2019-01-16 ENCOUNTER — Ambulatory Visit: Payer: BLUE CROSS/BLUE SHIELD | Admitting: Family Medicine

## 2019-01-16 ENCOUNTER — Encounter: Payer: Self-pay | Admitting: Family Medicine

## 2019-01-16 ENCOUNTER — Ambulatory Visit: Payer: Self-pay | Admitting: Family Medicine

## 2019-01-16 VITALS — BP 110/72 | HR 50 | Temp 97.8°F | Wt 213.0 lb

## 2019-01-16 DIAGNOSIS — L509 Urticaria, unspecified: Secondary | ICD-10-CM

## 2019-01-16 DIAGNOSIS — L255 Unspecified contact dermatitis due to plants, except food: Secondary | ICD-10-CM | POA: Diagnosis not present

## 2019-01-16 MED ORDER — PREDNISONE 10 MG (21) PO TBPK: ORAL_TABLET | ORAL | 0 refills | Status: DC

## 2019-01-16 NOTE — Telephone Encounter (Signed)
Prednisone 10mg --6 day taper .. also try topical caladryl.

## 2019-01-16 NOTE — Telephone Encounter (Signed)
Please review. Thanks!  

## 2019-01-16 NOTE — Telephone Encounter (Signed)
Sent this to the wrong provider. Please review. Thanks!

## 2019-01-16 NOTE — Telephone Encounter (Signed)
Cancel message and med not sent in, patient came in to office wanting to be seen

## 2019-01-16 NOTE — Telephone Encounter (Signed)
Pt has rash all over his body that itches.  He thinks maybe it is poison oak.     Please advise  No appts left  CB#  [phone]  Thanks teri

## 2019-01-16 NOTE — Progress Notes (Signed)
John Baxter  MRN: [id number] DOB: Aug 15, 1951  Subjective:  HPI   The patient is a 68 year old male who presents for evaluation of what he believes to be poison ivy.  He states about a week ago he started having a little rash on his legs.  It has since spread to under his arms and states it is worst there. He has not tried anything on the rash, he does admit to it being itchy. He reports that it wasn't too bad last week but yesterday and today it has gotten much worse.  Patient Active Problem List   Diagnosis Date Noted  . Allergic rhinitis 06/21/2015  . Cardiac dysrhythmia 06/21/2015  . Atrial fibrillation ([place]) 06/21/2015  . Adult hypothyroidism 06/21/2015  . Arthritis, degenerative 06/21/2015  . OP (osteoporosis) 06/21/2015  . Rheumatoid arthritis ([place]) 06/21/2015  . Apnea, sleep 06/21/2015   No past medical history on file.  Past Surgical History:  Procedure Laterality Date  . TOE SURGERY     Ingrown nail x2   Family History  Problem Relation Age of Onset  . Stroke Mother   . Arthritis Mother   . Heart disease Father   . Cancer Father        lung  . Blindness Father 76   Social History   Socioeconomic History  . Marital status: Married    Spouse name: Not on file  . Number of children: 0  . Years of education: Not on file  . Highest education level: Not on file  Occupational History    Comment: Pastoral care pastor  Social Needs  . Financial resource strain: Not on file  . Food insecurity:    Worry: Not on file    Inability: Not on file  . Transportation needs:    Medical: Not on file    Non-medical: Not on file  Tobacco Use  . Smoking status: Never Smoker  . Smokeless tobacco: Never Used  Substance and Sexual Activity  . Alcohol use: Yes    Comment: occasionally 3 beers a week  . Drug use: No  . Sexual activity: Not on file  Lifestyle  . Physical activity:    Days per week: Not on file    Minutes per session: Not on file  . Stress: Not on  file  Relationships  . Social connections:    Talks on phone: Not on file    Gets together: Not on file    Attends religious service: Not on file    Active member of club or organization: Not on file    Attends meetings of clubs or organizations: Not on file    Relationship status: Not on file  . Intimate partner violence:    Fear of current or ex partner: Not on file    Emotionally abused: Not on file    Physically abused: Not on file    Forced sexual activity: Not on file  Other Topics Concern  . Not on file  Social History Narrative  . Not on file   Outpatient Encounter Medications as of 01/16/2019  Medication Sig Note  . acyclovir (ZOVIRAX) 400 MG tablet Take 400 mg by mouth 5 (five) times daily.   Marland Kitchen aspirin (ASPIRIN ADULT LOW STRENGTH) 81 MG chewable tablet Chew by mouth. 06/21/2015: Received from: [facility]  . fluorometholone (FML) 0.1 % ophthalmic suspension SHAKE LQ AND INT 1 GTT IN OS QID   . folic acid (FOLVITE) 1 MG tablet Take by mouth.  06/21/2015: Received from: [facility]  . Ganciclovir (ZIRGAN) 0.15 % GEL Apply to eye.   Marland Kitchen HUMIRA PEN 40 MG/0.4ML PNKT Inject 1 pen/syringe subcutaneously every other week   . levothyroxine (SYNTHROID, LEVOTHROID) 100 MCG tablet TAKE 1 TABLET BY MOUTH ONCE DAILY   . methotrexate 2.5 MG tablet Take 10 mg by mouth. Takes 8 tablets once a week 06/21/2015: Received from: [facility]  . metoprolol succinate (TOPROL XL) 25 MG 24 hr tablet Take 25 mg by mouth daily.  06/21/2015: Received from: [facility]  . mometasone (ELOCON) 0.1 % cream APP 1 APPLINCATION ON THE AFFECTED NECK SKIN AREA QD UP TO 5 DAYS PER WK UNTIL CLEAR UTD   . OPHTHALMIC IRRIGATION SOLUTION OP Apply to eye. Plasma rich growth factor ophthalmic solution from [facility]   . RESTASIS 0.05 % ophthalmic emulsion INT 1 GTT IN OU BID    No facility-administered encounter medications on file as of 01/16/2019.    No Known  Allergies  Review of Systems  Skin: Positive for itching and rash.    Objective:  BP 110/72 (BP Location: Right Arm, Patient Position: Sitting, Cuff Size: Normal)   Pulse (!) 50   Temp 97.8 F (36.6 C) (Oral)   Wt 213 lb (96.6 kg)   SpO2 97%   BMI 30.56 kg/m   Physical Exam  Constitutional: He is oriented to person, place, and time and well-developed, well-nourished, and in no distress.  HENT:  Head: Normocephalic.  Eyes: Conjunctivae are normal.  Neck: Neck supple.  Pulmonary/Chest: Effort normal.  Abdominal: Soft.  Musculoskeletal: Normal range of motion.  Neurological: He is alert and oriented to person, place, and time.  Skin: Rash noted.  Psychiatric: Mood, affect and judgment normal.    Assessment and Plan :   1. Urticaria Developed hives in both axillae over the past week. Also, noticed some swelling of the lower lip that has resolved. No new medications, foods, etc. Recommend prednisone taper and may use antihistamine prn itching. Must continue to protect himself from any infections (especially the COVID-19) with his history of RA and being on Humira with Methotrexate. Recheck prn. - predniSONE (STERAPRED UNI-PAK 21 TAB) 10 MG (21) TBPK tablet; Taper as directed on the package (6,5,4,3,2,1)  Dispense: 21 tablet; Refill: 0  2. Rhus dermatitis Thinks some of the linear rash lesions on the right lower leg at the knee, was poison ivy dermatitis from exposure at home in the yard. May use Calamine lotion and add a Prednisone taper for 6 days. Recheck prn. - predniSONE (STERAPRED UNI-PAK 21 TAB) 10 MG (21) TBPK tablet; Taper as directed on the package (6,5,4,3,2,1)  Dispense: 21 tablet; Refill: 0

## 2019-01-31 ENCOUNTER — Other Ambulatory Visit: Payer: Self-pay | Admitting: Family Medicine

## 2019-01-31 ENCOUNTER — Encounter: Payer: Self-pay | Admitting: Family Medicine

## 2019-01-31 DIAGNOSIS — L255 Unspecified contact dermatitis due to plants, except food: Secondary | ICD-10-CM

## 2019-01-31 DIAGNOSIS — L509 Urticaria, unspecified: Secondary | ICD-10-CM

## 2019-01-31 MED ORDER — PREDNISONE 10 MG (21) PO TBPK: ORAL_TABLET | ORAL | 0 refills | Status: DC

## 2019-02-07 DIAGNOSIS — M069 Rheumatoid arthritis, unspecified: Secondary | ICD-10-CM | POA: Diagnosis not present

## 2019-02-07 DIAGNOSIS — H16122 Filamentary keratitis, left eye: Secondary | ICD-10-CM | POA: Diagnosis not present

## 2019-02-07 DIAGNOSIS — H2513 Age-related nuclear cataract, bilateral: Secondary | ICD-10-CM | POA: Diagnosis not present

## 2019-02-07 DIAGNOSIS — H16002 Unspecified corneal ulcer, left eye: Secondary | ICD-10-CM | POA: Diagnosis not present

## 2019-02-21 DIAGNOSIS — Z1159 Encounter for screening for other viral diseases: Secondary | ICD-10-CM | POA: Diagnosis not present

## 2019-02-21 DIAGNOSIS — Z01818 Encounter for other preprocedural examination: Secondary | ICD-10-CM | POA: Diagnosis not present

## 2019-02-21 DIAGNOSIS — H2512 Age-related nuclear cataract, left eye: Secondary | ICD-10-CM | POA: Diagnosis not present

## 2019-02-23 DIAGNOSIS — H2512 Age-related nuclear cataract, left eye: Secondary | ICD-10-CM | POA: Diagnosis not present

## 2019-02-23 DIAGNOSIS — H259 Unspecified age-related cataract: Secondary | ICD-10-CM | POA: Diagnosis not present

## 2019-02-23 DIAGNOSIS — Z9989 Dependence on other enabling machines and devices: Secondary | ICD-10-CM | POA: Diagnosis not present

## 2019-02-23 DIAGNOSIS — G4733 Obstructive sleep apnea (adult) (pediatric): Secondary | ICD-10-CM | POA: Diagnosis not present

## 2019-02-23 DIAGNOSIS — I4891 Unspecified atrial fibrillation: Secondary | ICD-10-CM | POA: Diagnosis not present

## 2019-02-23 DIAGNOSIS — M069 Rheumatoid arthritis, unspecified: Secondary | ICD-10-CM | POA: Diagnosis not present

## 2019-02-23 DIAGNOSIS — E039 Hypothyroidism, unspecified: Secondary | ICD-10-CM | POA: Diagnosis not present

## 2019-02-23 DIAGNOSIS — Z79899 Other long term (current) drug therapy: Secondary | ICD-10-CM | POA: Diagnosis not present

## 2019-02-23 DIAGNOSIS — E782 Mixed hyperlipidemia: Secondary | ICD-10-CM | POA: Diagnosis not present

## 2019-02-23 DIAGNOSIS — Z87891 Personal history of nicotine dependence: Secondary | ICD-10-CM | POA: Diagnosis not present

## 2019-02-23 DIAGNOSIS — Z7982 Long term (current) use of aspirin: Secondary | ICD-10-CM | POA: Diagnosis not present

## 2019-03-17 DIAGNOSIS — E782 Mixed hyperlipidemia: Secondary | ICD-10-CM | POA: Diagnosis not present

## 2019-03-17 DIAGNOSIS — I340 Nonrheumatic mitral (valve) insufficiency: Secondary | ICD-10-CM | POA: Diagnosis not present

## 2019-03-17 DIAGNOSIS — I482 Chronic atrial fibrillation, unspecified: Secondary | ICD-10-CM | POA: Diagnosis not present

## 2019-03-17 DIAGNOSIS — G4733 Obstructive sleep apnea (adult) (pediatric): Secondary | ICD-10-CM | POA: Diagnosis not present

## 2019-04-05 DIAGNOSIS — H2512 Age-related nuclear cataract, left eye: Secondary | ICD-10-CM | POA: Diagnosis not present

## 2019-04-05 DIAGNOSIS — H16012 Central corneal ulcer, left eye: Secondary | ICD-10-CM | POA: Diagnosis not present

## 2019-04-05 DIAGNOSIS — Z961 Presence of intraocular lens: Secondary | ICD-10-CM | POA: Diagnosis not present

## 2019-04-05 DIAGNOSIS — H16002 Unspecified corneal ulcer, left eye: Secondary | ICD-10-CM | POA: Diagnosis not present

## 2019-04-11 DIAGNOSIS — Z9889 Other specified postprocedural states: Secondary | ICD-10-CM | POA: Diagnosis not present

## 2019-04-11 DIAGNOSIS — H2511 Age-related nuclear cataract, right eye: Secondary | ICD-10-CM | POA: Diagnosis not present

## 2019-04-11 DIAGNOSIS — Z961 Presence of intraocular lens: Secondary | ICD-10-CM | POA: Diagnosis not present

## 2019-04-11 DIAGNOSIS — H02883 Meibomian gland dysfunction of right eye, unspecified eyelid: Secondary | ICD-10-CM | POA: Diagnosis not present

## 2019-04-11 DIAGNOSIS — H16012 Central corneal ulcer, left eye: Secondary | ICD-10-CM | POA: Diagnosis not present

## 2019-04-11 DIAGNOSIS — M069 Rheumatoid arthritis, unspecified: Secondary | ICD-10-CM | POA: Diagnosis not present

## 2019-04-14 DIAGNOSIS — M0579 Rheumatoid arthritis with rheumatoid factor of multiple sites without organ or systems involvement: Secondary | ICD-10-CM | POA: Diagnosis not present

## 2019-04-14 DIAGNOSIS — H16002 Unspecified corneal ulcer, left eye: Secondary | ICD-10-CM | POA: Diagnosis not present

## 2019-04-17 DIAGNOSIS — M7061 Trochanteric bursitis, right hip: Secondary | ICD-10-CM | POA: Diagnosis not present

## 2019-04-17 DIAGNOSIS — Z79899 Other long term (current) drug therapy: Secondary | ICD-10-CM | POA: Diagnosis not present

## 2019-04-17 DIAGNOSIS — M0579 Rheumatoid arthritis with rheumatoid factor of multiple sites without organ or systems involvement: Secondary | ICD-10-CM | POA: Diagnosis not present

## 2019-04-17 DIAGNOSIS — H16002 Unspecified corneal ulcer, left eye: Secondary | ICD-10-CM | POA: Diagnosis not present

## 2019-04-18 DIAGNOSIS — H16012 Central corneal ulcer, left eye: Secondary | ICD-10-CM | POA: Diagnosis not present

## 2019-04-24 DIAGNOSIS — Z20828 Contact with and (suspected) exposure to other viral communicable diseases: Secondary | ICD-10-CM | POA: Diagnosis not present

## 2019-04-24 DIAGNOSIS — H16012 Central corneal ulcer, left eye: Secondary | ICD-10-CM | POA: Diagnosis not present

## 2019-04-25 DIAGNOSIS — H02883 Meibomian gland dysfunction of right eye, unspecified eyelid: Secondary | ICD-10-CM | POA: Diagnosis not present

## 2019-04-25 DIAGNOSIS — M069 Rheumatoid arthritis, unspecified: Secondary | ICD-10-CM | POA: Diagnosis not present

## 2019-04-25 DIAGNOSIS — H2511 Age-related nuclear cataract, right eye: Secondary | ICD-10-CM | POA: Diagnosis not present

## 2019-04-25 DIAGNOSIS — H16012 Central corneal ulcer, left eye: Secondary | ICD-10-CM | POA: Diagnosis not present

## 2019-04-26 ENCOUNTER — Ambulatory Visit: Payer: Self-pay | Admitting: Family Medicine

## 2019-04-27 DIAGNOSIS — I482 Chronic atrial fibrillation, unspecified: Secondary | ICD-10-CM | POA: Diagnosis not present

## 2019-04-27 DIAGNOSIS — H168 Other keratitis: Secondary | ICD-10-CM | POA: Diagnosis not present

## 2019-04-27 DIAGNOSIS — Z7982 Long term (current) use of aspirin: Secondary | ICD-10-CM | POA: Diagnosis not present

## 2019-04-27 DIAGNOSIS — E039 Hypothyroidism, unspecified: Secondary | ICD-10-CM | POA: Diagnosis not present

## 2019-04-27 DIAGNOSIS — Z9989 Dependence on other enabling machines and devices: Secondary | ICD-10-CM | POA: Diagnosis not present

## 2019-04-27 DIAGNOSIS — Z87891 Personal history of nicotine dependence: Secondary | ICD-10-CM | POA: Diagnosis not present

## 2019-04-27 DIAGNOSIS — Z79899 Other long term (current) drug therapy: Secondary | ICD-10-CM | POA: Diagnosis not present

## 2019-04-27 DIAGNOSIS — Z961 Presence of intraocular lens: Secondary | ICD-10-CM | POA: Diagnosis not present

## 2019-04-27 DIAGNOSIS — E782 Mixed hyperlipidemia: Secondary | ICD-10-CM | POA: Diagnosis not present

## 2019-04-27 DIAGNOSIS — Z9842 Cataract extraction status, left eye: Secondary | ICD-10-CM | POA: Diagnosis not present

## 2019-04-27 DIAGNOSIS — M069 Rheumatoid arthritis, unspecified: Secondary | ICD-10-CM | POA: Diagnosis not present

## 2019-04-27 DIAGNOSIS — H16002 Unspecified corneal ulcer, left eye: Secondary | ICD-10-CM | POA: Diagnosis not present

## 2019-04-27 DIAGNOSIS — G4733 Obstructive sleep apnea (adult) (pediatric): Secondary | ICD-10-CM | POA: Diagnosis not present

## 2019-05-09 DIAGNOSIS — Z947 Corneal transplant status: Secondary | ICD-10-CM | POA: Diagnosis not present

## 2019-05-09 DIAGNOSIS — B0052 Herpesviral keratitis: Secondary | ICD-10-CM | POA: Diagnosis not present

## 2019-05-11 ENCOUNTER — Encounter: Payer: Self-pay | Admitting: Family Medicine

## 2019-05-11 ENCOUNTER — Other Ambulatory Visit: Payer: Self-pay

## 2019-05-11 ENCOUNTER — Ambulatory Visit (INDEPENDENT_AMBULATORY_CARE_PROVIDER_SITE_OTHER): Payer: BC Managed Care – PPO | Admitting: Family Medicine

## 2019-05-11 VITALS — BP 108/62 | HR 62 | Temp 98.1°F | Resp 16 | Ht 70.0 in | Wt 216.0 lb

## 2019-05-11 DIAGNOSIS — I4811 Longstanding persistent atrial fibrillation: Secondary | ICD-10-CM

## 2019-05-11 DIAGNOSIS — E039 Hypothyroidism, unspecified: Secondary | ICD-10-CM | POA: Diagnosis not present

## 2019-05-11 DIAGNOSIS — T148XXA Other injury of unspecified body region, initial encounter: Secondary | ICD-10-CM

## 2019-05-11 DIAGNOSIS — Z23 Encounter for immunization: Secondary | ICD-10-CM | POA: Diagnosis not present

## 2019-05-11 DIAGNOSIS — M069 Rheumatoid arthritis, unspecified: Secondary | ICD-10-CM

## 2019-05-11 DIAGNOSIS — Z947 Corneal transplant status: Secondary | ICD-10-CM

## 2019-05-11 NOTE — Progress Notes (Signed)
Patient: John Baxter Male    DOB: 1951-04-14   68 y.o.   MRN: [id number] Visit Date: 05/11/2019  Today's Provider: [name], MD   Chief Complaint  Patient presents with  . Hypertension   Subjective:   HPI  He had left corneal transplant 2 weeks ago.  Hypertension, follow-up:  BP Readings from Last 3 Encounters:  05/11/19 108/62  01/16/19 110/72  10/26/18 100/60    He was last seen for hypertension 6 months ago.  BP at that visit was 100/60. Management since that visit includes no changes. He reports good compliance with treatment. He is not having side effects.  He is exercising. He is not adherent to low salt diet.   Outside blood pressures are checked occasionally. He is experiencing none.  Patient denies exertional chest pressure/discomfort, lower extremity edema and palpitations.    Weight trend: stable Wt Readings from Last 3 Encounters:  05/11/19 216 lb (98 kg)  01/16/19 213 lb (96.6 kg)  10/26/18 213 lb (96.6 kg)    Current diet: well balanced   No Known Allergies   Current Outpatient Medications:  .  acyclovir (ZOVIRAX) 400 MG tablet, Take 400 mg by mouth 5 (five) times daily., Disp: , Rfl:  .  aspirin (ASPIRIN ADULT LOW STRENGTH) 81 MG chewable tablet, Chew by mouth., Disp: , Rfl:  .  fluorometholone (FML) 0.1 % ophthalmic suspension, SHAKE LQ AND INT 1 GTT IN OS QID, Disp: , Rfl:  .  folic acid (FOLVITE) 1 MG tablet, Take by mouth., Disp: , Rfl:  .  Ganciclovir (ZIRGAN) 0.15 % GEL, Apply to eye., Disp: , Rfl:  .  HUMIRA PEN 40 MG/0.4ML PNKT, Inject 1 pen/syringe subcutaneously every other week, Disp: , Rfl:  .  levothyroxine (SYNTHROID, LEVOTHROID) 100 MCG tablet, TAKE 1 TABLET BY MOUTH ONCE DAILY, Disp: 90 tablet, Rfl: 3 .  methotrexate 2.5 MG tablet, Take 10 mg by mouth. Takes 8 tablets once a week, Disp: , Rfl:  .  metoprolol succinate (TOPROL XL) 25 MG 24 hr tablet, Take 25 mg by mouth daily. , Disp: , Rfl:  .  mometasone  (ELOCON) 0.1 % cream, APP 1 APPLINCATION ON THE AFFECTED NECK SKIN AREA QD UP TO 5 DAYS PER WK UNTIL CLEAR UTD, Disp: , Rfl: 1 .  OPHTHALMIC IRRIGATION SOLUTION OP, Apply to eye. Plasma rich growth factor ophthalmic solution from [facility], Disp: , Rfl:  .  RESTASIS 0.05 % ophthalmic emulsion, INT 1 GTT IN OU BID, Disp: , Rfl:  .  predniSONE (STERAPRED UNI-PAK 21 TAB) 10 MG (21) TBPK tablet, Taper as directed on the package (6,5,4,3,2,1) (Patient not taking: Reported on 05/11/2019), Disp: 21 tablet, Rfl: 0  Review of Systems  Constitutional: Negative for activity change and fatigue.  Eyes: Positive for visual disturbance.  Respiratory: Negative for cough and shortness of breath.   Cardiovascular: Negative for chest pain, palpitations and leg swelling.  Gastrointestinal: Negative.   Endocrine: Negative.   Musculoskeletal: Negative for arthralgias and joint swelling.  Allergic/Immunologic: Negative.   Neurological: Negative for dizziness, light-headedness and headaches.  Psychiatric/Behavioral: Negative for agitation, self-injury, sleep disturbance and suicidal ideas. The patient is not nervous/anxious.     Social History   Tobacco Use  . Smoking status: Never Smoker  . Smokeless tobacco: Never Used  Substance Use Topics  . Alcohol use: Yes    Comment: occasionally 3 beers a week      Objective:   BP 108/62  Pulse 62   Temp 98.1 F (36.7 C)   Resp 16   Ht 5\' 10"  (1.778 m)   Wt 216 lb (98 kg)   SpO2 98%   BMI 30.99 kg/m  Vitals:   05/11/19 0829  BP: 108/62  Pulse: 62  Resp: 16  Temp: 98.1 F (36.7 C)  SpO2: 98%  Weight: 216 lb (98 kg)  Height: 5\' 10"  (1.778 m)  Body mass index is 30.99 kg/m.   Physical Exam Vitals signs reviewed.  Constitutional:      Appearance: He is well-developed.  HENT:     Head: Normocephalic and atraumatic.     Right Ear: External ear normal.     Left Ear: External ear normal.     Nose: Nose normal.  Eyes:     General: No scleral  icterus.    Conjunctiva/sclera: Conjunctivae normal.  Neck:     Thyroid: No thyromegaly.  Cardiovascular:     Rate and Rhythm: Normal rate and regular rhythm.     Heart sounds: Normal heart sounds.  Pulmonary:     Effort: Pulmonary effort is normal.     Breath sounds: Normal breath sounds.  Abdominal:     Palpations: Abdomen is soft.  Skin:    General: Skin is warm and dry.     Comments: Punture wound of left wrist--noninfected.  Neurological:     Mental Status: He is alert and oriented to person, place, and time.  Psychiatric:        Behavior: Behavior normal.        Thought Content: Thought content normal.        Judgment: Judgment normal.      No results found for any visits on 05/11/19.     Assessment & Plan    1. Longstanding persistent atrial fibrillation Dr Nehemiah Massed  2. Adult hypothyroidism   3. Flu vaccine need  - Flu Vaccine QUAD High Dose(Fluad)  4. Puncture wound Noninfected. - Td : Tetanus/diphtheria >7yo Preservative  free  5. Rheumatoid arthritis involving both hands, unspecified rheumatoid factor presence ([place]) Per Dr Jefm Bryant.  6. Cornea transplant recipient F/[facility] at [facility].      Cranford Mon, MD  [facility]

## 2019-05-17 DIAGNOSIS — Z947 Corneal transplant status: Secondary | ICD-10-CM | POA: Diagnosis not present

## 2019-05-23 DIAGNOSIS — Z947 Corneal transplant status: Secondary | ICD-10-CM | POA: Diagnosis not present

## 2019-05-23 DIAGNOSIS — Z20828 Contact with and (suspected) exposure to other viral communicable diseases: Secondary | ICD-10-CM | POA: Diagnosis not present

## 2019-05-31 DIAGNOSIS — H18892 Other specified disorders of cornea, left eye: Secondary | ICD-10-CM | POA: Diagnosis not present

## 2019-06-08 DIAGNOSIS — I482 Chronic atrial fibrillation, unspecified: Secondary | ICD-10-CM | POA: Diagnosis not present

## 2019-06-09 DIAGNOSIS — I482 Chronic atrial fibrillation, unspecified: Secondary | ICD-10-CM | POA: Diagnosis not present

## 2019-06-09 DIAGNOSIS — I428 Other cardiomyopathies: Secondary | ICD-10-CM | POA: Insufficient documentation

## 2019-06-09 DIAGNOSIS — G4733 Obstructive sleep apnea (adult) (pediatric): Secondary | ICD-10-CM | POA: Diagnosis not present

## 2019-06-09 DIAGNOSIS — E782 Mixed hyperlipidemia: Secondary | ICD-10-CM | POA: Diagnosis not present

## 2019-06-28 DIAGNOSIS — H16002 Unspecified corneal ulcer, left eye: Secondary | ICD-10-CM | POA: Diagnosis not present

## 2019-06-28 DIAGNOSIS — H168 Other keratitis: Secondary | ICD-10-CM | POA: Diagnosis not present

## 2019-06-28 DIAGNOSIS — H18892 Other specified disorders of cornea, left eye: Secondary | ICD-10-CM | POA: Diagnosis not present

## 2019-07-07 DIAGNOSIS — G4733 Obstructive sleep apnea (adult) (pediatric): Secondary | ICD-10-CM | POA: Diagnosis not present

## 2019-07-07 DIAGNOSIS — I480 Paroxysmal atrial fibrillation: Secondary | ICD-10-CM | POA: Diagnosis not present

## 2019-07-07 DIAGNOSIS — M0579 Rheumatoid arthritis with rheumatoid factor of multiple sites without organ or systems involvement: Secondary | ICD-10-CM | POA: Diagnosis not present

## 2019-07-07 DIAGNOSIS — Z79899 Other long term (current) drug therapy: Secondary | ICD-10-CM | POA: Diagnosis not present

## 2019-07-07 DIAGNOSIS — I428 Other cardiomyopathies: Secondary | ICD-10-CM | POA: Diagnosis not present

## 2019-07-07 DIAGNOSIS — I482 Chronic atrial fibrillation, unspecified: Secondary | ICD-10-CM | POA: Diagnosis not present

## 2019-07-18 ENCOUNTER — Other Ambulatory Visit: Payer: Self-pay

## 2019-07-18 DIAGNOSIS — Z20822 Contact with and (suspected) exposure to covid-19: Secondary | ICD-10-CM

## 2019-07-20 LAB — NOVEL CORONAVIRUS, NAA: SARS-CoV-2, NAA: DETECTED — AB

## 2019-07-22 ENCOUNTER — Telehealth: Payer: Self-pay | Admitting: Unknown Physician Specialty

## 2019-07-22 NOTE — Telephone Encounter (Signed)
Discussed with patient about Covid symptoms and the use of bamlanivimab, a monoclonal antibody infusion for those with mild to moderate Covid symptoms and at a high risk of hospitalization.  Pt is not qualified as lacks symptoms

## 2019-07-24 ENCOUNTER — Telehealth: Payer: Self-pay

## 2019-07-24 NOTE — Telephone Encounter (Signed)
Called and spoke with patient about testing positive for Covid 19. He wanted to notify us about his results. His wife has also tested positive as well. He is having no complications at this time but will contact our office or goto the Hospital if his symptoms worsen. He gave verbal understanding.

## 2019-07-24 NOTE — Telephone Encounter (Signed)
Copied from [place] [id number]. Topic: General - Inquiry >> Jul 24, 2019  9:10 AM Mathis Bud wrote: Reason for CRM: Patient states he was tested for covid and he did test positive.  Patient states he feels fine. Call back     [phone]

## 2019-09-03 ENCOUNTER — Other Ambulatory Visit: Payer: Self-pay | Admitting: Family Medicine

## 2019-09-04 NOTE — Telephone Encounter (Signed)
Requested Prescriptions  Pending Prescriptions Disp Refills  . levothyroxine (SYNTHROID) 100 MCG tablet [Pharmacy Med Name: LEVOTHYROXINE 0.100MG  (100MCG) TAB] 90 tablet 3    Sig: TAKE 1 TABLET BY MOUTH EVERY DAY     Endocrinology:  Hypothyroid Agents Failed - 09/03/2019  7:00 AM      Failed - TSH needs to be rechecked within 3 months after an abnormal result. Refill until TSH is due.      Passed - TSH in normal range and within 360 days    TSH  Date Value Ref Range Status  10/26/2018 3.160 0.450 - 4.500 uIU/mL Final         Passed - Valid encounter within last 12 months    Recent Outpatient Visits          3 months ago Longstanding persistent atrial fibrillation   [facility] Jerrol Banana., MD   7 months ago [place], [place]ickki Muff, [place]   10 months ago Annual physical exam   [facility] Jerrol Banana., MD   11 months ago Sinusitis, unspecified chronicity, unspecified location   [facility] Jerrol Banana., MD   1 year ago Rheumatoid arthritis involving both hands, unspecified rheumatoid factor presence [facility])   [facility] Jerrol Banana., MD

## 2019-09-26 ENCOUNTER — Telehealth: Payer: Self-pay | Admitting: Family Medicine

## 2019-09-26 NOTE — Telephone Encounter (Signed)
Patient calling states he has spoken with [facility] and they state Dr. Rosanna Randy is out of network. They gave patient a providers line number and stated that practice could contact them, as so patient could continue seeing Dr. Rosanna Randy.     Providers line for [facility]-  [phone]

## 2019-09-30 ENCOUNTER — Ambulatory Visit: Payer: Medicare Other | Attending: Internal Medicine

## 2019-09-30 DIAGNOSIS — Z23 Encounter for immunization: Secondary | ICD-10-CM | POA: Insufficient documentation

## 2019-09-30 NOTE — Progress Notes (Signed)
   Covid-19 Vaccination Clinic  Name:  John Baxter    MRN: [id number] DOB: 05/29/1951  09/30/2019  [name] was observed post Covid-19 immunization for 15 minutes without incidence. He was provided with Vaccine Information Sheet and instruction to access the V-Safe system.   [name] was instructed to call 911 with any severe reactions post vaccine: Marland Kitchen Difficulty breathing  . Swelling of your face and throat  . A fast heartbeat  . A bad rash all over your body  . Dizziness and weakness    Immunizations Administered    Name Date Dose VIS Date Route   Pfizer COVID-19 Vaccine 09/30/2019  8:37 AM 0.3 mL 07/28/2019 Intramuscular   Manufacturer: [place]   Lot: [id number]   [place]: [id number]

## 2019-10-06 DIAGNOSIS — Z79899 Other long term (current) drug therapy: Secondary | ICD-10-CM | POA: Diagnosis not present

## 2019-10-06 DIAGNOSIS — M0579 Rheumatoid arthritis with rheumatoid factor of multiple sites without organ or systems involvement: Secondary | ICD-10-CM | POA: Diagnosis not present

## 2019-10-16 DIAGNOSIS — M0579 Rheumatoid arthritis with rheumatoid factor of multiple sites without organ or systems involvement: Secondary | ICD-10-CM | POA: Diagnosis not present

## 2019-10-16 DIAGNOSIS — H16002 Unspecified corneal ulcer, left eye: Secondary | ICD-10-CM | POA: Diagnosis not present

## 2019-10-16 DIAGNOSIS — M79674 Pain in right toe(s): Secondary | ICD-10-CM | POA: Diagnosis not present

## 2019-10-18 NOTE — Telephone Encounter (Signed)
Patient is calling to check the status of whether Dr. Rosanna Randy could be in network with [facility] [employer].  Patient would like to continue to see Dr. Rosanna Randy.  Please this # for Providers [phone].  Also please let patient know before his appt. Whether he can continue to see Dr. Rosanna Randy.  CB# [phone]

## 2019-10-21 ENCOUNTER — Ambulatory Visit: Payer: Medicare Other | Attending: Internal Medicine

## 2019-10-21 DIAGNOSIS — Z23 Encounter for immunization: Secondary | ICD-10-CM | POA: Insufficient documentation

## 2019-10-21 NOTE — Progress Notes (Signed)
   Covid-19 Vaccination Clinic  Name:  John Baxter    MRN: [id number] DOB: 06-02-1951  10/21/2019  John Baxter was observed post Covid-19 immunization for 15 minutes without incident. He was provided with Vaccine Information Sheet and instruction to access the V-Safe system.   John Baxter was instructed to call 911 with any severe reactions post vaccine: Marland Kitchen Difficulty breathing  . Swelling of face and throat  . A fast heartbeat  . A bad rash all over body  . Dizziness and weakness   Immunizations Administered    Name Date Dose VIS Date Route   Pfizer COVID-19 Vaccine 10/21/2019  8:19 AM 0.3 mL 07/28/2019 Intramuscular   Manufacturer: [employer]   Lot: [id number]   [place]: [id number]

## 2019-10-23 NOTE — Telephone Encounter (Signed)
Pt called again about [employer] advising him that Dr. Rosanna Randy is no longer in network/ advised Pt that manager will call him tomorrow

## 2019-10-25 DIAGNOSIS — H16002 Unspecified corneal ulcer, left eye: Secondary | ICD-10-CM | POA: Diagnosis not present

## 2019-10-25 DIAGNOSIS — H18892 Other specified disorders of cornea, left eye: Secondary | ICD-10-CM | POA: Diagnosis not present

## 2019-10-30 ENCOUNTER — Encounter: Payer: Self-pay | Admitting: Family Medicine

## 2019-11-16 ENCOUNTER — Other Ambulatory Visit: Payer: Self-pay

## 2019-11-16 ENCOUNTER — Ambulatory Visit: Payer: Medicare Other | Admitting: Dermatology

## 2019-11-16 DIAGNOSIS — L570 Actinic keratosis: Secondary | ICD-10-CM | POA: Diagnosis not present

## 2019-11-16 DIAGNOSIS — D2230 Melanocytic nevi of unspecified part of face: Secondary | ICD-10-CM

## 2019-11-16 DIAGNOSIS — Z1283 Encounter for screening for malignant neoplasm of skin: Secondary | ICD-10-CM | POA: Diagnosis not present

## 2019-11-16 DIAGNOSIS — D225 Melanocytic nevi of trunk: Secondary | ICD-10-CM

## 2019-11-16 DIAGNOSIS — D229 Melanocytic nevi, unspecified: Secondary | ICD-10-CM

## 2019-11-16 DIAGNOSIS — D1801 Hemangioma of skin and subcutaneous tissue: Secondary | ICD-10-CM

## 2019-11-16 DIAGNOSIS — D1800 Hemangioma unspecified site: Secondary | ICD-10-CM

## 2019-11-16 DIAGNOSIS — L578 Other skin changes due to chronic exposure to nonionizing radiation: Secondary | ICD-10-CM

## 2019-11-16 DIAGNOSIS — L814 Other melanin hyperpigmentation: Secondary | ICD-10-CM

## 2019-11-16 DIAGNOSIS — L821 Other seborrheic keratosis: Secondary | ICD-10-CM

## 2019-11-16 DIAGNOSIS — L719 Rosacea, unspecified: Secondary | ICD-10-CM
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
## 2019-11-16 NOTE — Patient Instructions (Signed)
Actinic Keratosis °An actinic keratosis is a precancerous growth on the skin. If there is more than one growth, the condition is called actinic keratoses. Actinic keratoses appear most often on areas of skin that get a lot of sun exposure, including the scalp, face, ears, lips, upper back, forearms, and the backs of the hands. °If left untreated, these growths may develop into a skin cancer called squamous cell carcinoma. It is important to have all these growths checked by a health care provider to determine the best treatment approach. °What are the causes? °Actinic keratoses are caused by getting too much ultraviolet (UV) radiation from the sun or other UV light sources. °What increases the risk? °You are more likely to develop this condition if you: °· Have light-colored skin and blue eyes. °· Have blond or red hair. °· Spend a lot of time in the sun. °· Do not protect your skin from the sun when outdoors. °· Are an older person. The risk of developing an actinic keratosis increases with age. °What are the signs or symptoms? °Actinic keratoses feel like scaly, rough spots of skin. Symptoms of this condition include growths that may: °· Be as small as a pinhead or as big as a quarter. °· Itch, hurt, or feel sensitive. °· Be skin-colored, light tan, dark tan, pink, or a combination of any of these colors. In most cases, the growths become red. °· Have a small piece of pink or gray skin (skin tag) growing from them. °It may be easier to notice actinic keratoses by feeling them, rather than seeing them. Sometimes, actinic keratoses disappear, but many reappear a few days to a few weeks later. °How is this diagnosed? °This condition is usually diagnosed with a physical exam. °· A tissue sample may be removed from the actinic keratosis and examined under a microscope (biopsy). °How is this treated? °If needed, this condition may be treated by: °· Scraping off the actinic keratosis (curettage). °· Freezing the actinic  keratosis with liquid nitrogen (cryosurgery). This causes the growth to eventually fall off the skin. °· Applying medicated creams or gels to destroy the cells in the growth. °· Applying chemicals to the actinic keratosis to make the outer layers of skin peel off (chemical peel). °· Using photodynamic therapy. In this procedure, medicated cream is applied to the actinic keratosis. This cream increases your skin's sensitivity to light. Then, a strong light is aimed at the actinic keratosis to destroy cells in the growth. °Follow these instructions at home: °Skin care °· Apply cool, wet cloths (cool compresses) to the affected areas. °· Do not scratch your skin. °· Check your skin regularly for any growths, especially growths that: °? Start to itch or bleed. °? Change in size, shape, or color. °Caring for the treated area °· Keep the treated area clean and dry as told by your health care provider. °· Do not apply any medicine, cream, or lotion to the treated area unless your health care provider tells you to do that. °· Do not pick at blisters or try to break them open. This can cause infection and scarring. °· If you have red or irritated skin after treatment, follow instructions from your health care provider about how to take care of the treated area. Make sure you: °? Wash your hands with soap and water before you change your bandage (dressing). If soap and water are not available, use hand sanitizer. °? Change your dressing as told by your health care provider. °· If   you have red or irritated skin after treatment, check your treated area every day for signs of infection. Check for: °? Redness, swelling, or pain. °? Fluid or blood. °? Warmth. °? Pus or a bad smell. °General instructions °· Take or apply over-the-counter and prescription medicines only as told by your health care provider. °· Return to your normal activities as told by your health care provider. Ask your health care provider what activities are  safe for you. °· Have a skin exam done every year by a health care provider who is a skin specialist (dermatologist). °· Keep all follow-up visits as told by your health care provider. This is important. °Lifestyle °· Do not use any products that contain nicotine or tobacco, such as cigarettes and e-cigarettes. If you need help quitting, ask your health care provider. °· Take steps to protect your skin from the sun. °? Try to avoid the sun between 10:00 a.m. and 4:00 p.m. This is when the UV light is the strongest. °? Use a sunscreen or sunblock with SPF 30 (sun protection factor 30) or greater. °? Apply sunscreen before you are exposed to sunlight and reapply as often as directed by the instructions on the sunscreen container. °? Always wear sunglasses that have UV protection, and always wear a hat and clothing to protect your skin from sunlight. °? When possible, avoid medicines that increase your sensitivity to sunlight. °? Do not use tanning beds or other indoor tanning devices. °Contact a health care provider if: °· You notice any changes or new growths on your skin. °· You have swelling, pain, or more redness around your treated area. °· You have fluid or blood coming from your treated area. °· Your treated area feels warm to the touch. °· You have pus or a bad smell coming from your treated area. °· You have a fever. °· You have a blister that becomes large and painful. °Summary °· An actinic keratosis is a precancerous growth on the skin. If there is more than one growth, the condition is called actinic keratoses. In some cases, if left untreated, these growths can develop into skin cancer. °· Check your skin regularly for any growths, especially growths that start to itch or bleed, or change in size, shape, or color. °· Take steps to protect your skin from the sun. °· Contact a health care provider if you notice any changes or new growths on your skin. °· Keep all follow-up visits as told by your health  care provider. This is important. °This information is not intended to replace advice given to you by your health care provider. Make sure you discuss any questions you have with your health care provider. °Document Revised: 12/14/2017 Document Reviewed: 12/14/2017 °Elsevier Patient Education © 2020 Elsevier Inc. ° °

## 2019-11-21 ENCOUNTER — Telehealth: Payer: Self-pay

## 2019-11-21 NOTE — Telephone Encounter (Signed)
LMTCB to inquire about a telephonic visit prior to CPE on 12/06/19.

## 2019-11-22 NOTE — Telephone Encounter (Signed)
Telephonic AWV scheduled for 11/27/19 @ 9:00 AM.

## 2019-11-23 NOTE — Progress Notes (Signed)
Subjective:   John Baxter is a 69 y.o. male who presents for Medicare Annual/Subsequent preventive examination.    This visit is being conducted through telemedicine due to the COVID-19 pandemic. This patient has given me verbal consent via doximity to conduct this visit, patient states they are participating from their home address. Some vital signs may be absent or patient reported.    Patient identification: identified by name, DOB, and current address  Review of Systems:  N/A  Cardiac Risk Factors include: advanced age (>58men, >35 women);hypertension;male gender     Objective:    Vitals: There were no vitals taken for this visit.  There is no height or weight on file to calculate BMI. Unable to obtain vitals due to visit being conducted via telephonically.   Advanced Directives 11/27/2019 08/05/2015  Does Patient Have a Medical Advance Directive? Yes No  Type of Paramedic of [place];Living will -  Copy of [place] in Chart? No - copy requested -    Tobacco Social History   Tobacco Use  Smoking Status Never Smoker  Smokeless Tobacco Never Used     Counseling given: Not Answered   Clinical Intake:  Pre-visit preparation completed: Yes  Pain : No/denies pain Pain Score: 0-No pain     Nutritional Risks: None Diabetes: No  How often do you need to have someone help you when you read instructions, pamphlets, or other written materials from your doctor or pharmacy?: 1 - Never  Interpreter Needed?: No  Information entered by :: [facility], LPN  Past Medical History:  Diagnosis Date  . Actinic keratosis   . Hypertension    Past Surgical History:  Procedure Laterality Date  . CORNEAL TRANSPLANT    . TOE SURGERY     Ingrown nail x2   Family History  Problem Relation Age of Onset  . Stroke Mother   . Arthritis Mother   . Heart disease Father   . Cancer Father        lung  . Blindness Father 40    Social History   Socioeconomic History  . Marital status: Married    Spouse name: Not on file  . Number of children: 0  . Years of education: Not on file  . Highest education level: Master's degree (e.g., MA, MS, MEng, MEd, MSW, MBA)  Occupational History    Comment: Pastoral care pastor  Tobacco Use  . Smoking status: Never Smoker  . Smokeless tobacco: Never Used  Substance and Sexual Activity  . Alcohol use: Yes    Alcohol/week: 3.0 standard drinks    Types: 3 Cans of beer per week  . Drug use: No  . Sexual activity: Not on file  Other Topics Concern  . Not on file  Social History Narrative  . Not on file   Social Determinants of Health   Financial Resource Strain: Low Risk   . Difficulty of Paying Living Expenses: Not hard at all  Food Insecurity: No Food Insecurity  . Worried About Charity fundraiser in the Last Year: Never true  . Ran Out of Food in the Last Year: Never true  Transportation Needs: No Transportation Needs  . Lack of Transportation (Medical): No  . Lack of Transportation (Non-Medical): No  Physical Activity: Inactive  . Days of Exercise per Week: 0 days  . Minutes of Exercise per Session: 0 min  Stress: No Stress Concern Present  . Feeling of Stress : Not at all  Social Connections: Not Isolated  . Frequency of Communication with Friends and Family: More than three times a week  . Frequency of Social Gatherings with Friends and Family: Twice a week  . Attends Religious Services: More than 4 times per year  . Active Member of Clubs or Organizations: Yes  . Attends Archivist Meetings: More than 4 times per year  . Marital Status: Married    Outpatient Encounter Medications as of 11/27/2019  Medication Sig  . acyclovir (ZOVIRAX) 400 MG tablet Take 400 mg by mouth 5 (five) times daily.  Marland Kitchen aspirin (ASPIRIN ADULT LOW STRENGTH) 81 MG chewable tablet Chew 81 mg by mouth daily.   . folic acid (FOLVITE) 1 MG tablet Take 1 mg by mouth daily.    . Ganciclovir (ZIRGAN) 0.15 % GEL Apply 1 drop to eye daily.   Marland Kitchen HUMIRA PEN 40 MG/0.4ML PNKT Inject 1 pen/syringe subcutaneously every other week  . levothyroxine (SYNTHROID) 100 MCG tablet TAKE 1 TABLET BY MOUTH EVERY DAY  . methotrexate 2.5 MG tablet Take 10 mg by mouth. Takes 4 tablets once a week  . metoprolol succinate (TOPROL XL) 25 MG 24 hr tablet Take 25 mg by mouth 2 (two) times daily.   . OPHTHALMIC IRRIGATION SOLUTION OP Apply to eye in the morning, at noon, in the evening, and at bedtime. Plasma rich growth factor ophthalmic solution from [facility]  . prednisoLONE acetate (PRED FORTE) 1 % ophthalmic suspension Place 1 drop into the left eye daily.   . RESTASIS 0.05 % ophthalmic emulsion Place 1 drop into the right eye 3 (three) times daily.   . fluorometholone (FML) 0.1 % ophthalmic suspension SHAKE LQ AND INT 1 GTT IN OS QID  . mometasone (ELOCON) 0.1 % cream APP 1 APPLINCATION ON THE AFFECTED NECK SKIN AREA QD UP TO 5 DAYS PER WK UNTIL CLEAR UTD  . predniSONE (STERAPRED UNI-PAK 21 TAB) 10 MG (21) TBPK tablet Taper as directed on the package (6,5,4,3,2,1) (Patient not taking: Reported on 11/27/2019)   No facility-administered encounter medications on file as of 11/27/2019.    Activities of Daily Living In your present state of health, do you have any difficulty performing the following activities: 11/27/2019  Hearing? N  Vision? 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  Difficulty concentrating or making decisions? N  Walking or climbing stairs? N  Dressing or bathing? N  Doing errands, shopping? N  Preparing Food and eating ? N  Using the Toilet? N  In the past six months, have you accidently leaked urine? N  Do you have problems with loss of bowel control? N  Managing your Medications? N  Managing your Finances? N  Housekeeping or managing your Housekeeping? N  Some recent data might be hidden    Patient Care Team: Jerrol Banana., MD as PCP - General  (Family Medicine) Ramonita Lab, Jovita Kussmaul, MD as Referring Physician (Ophthalmology) Emmaline Kluver., MD (Rheumatology) Corey Skains, MD as Consulting Physician (Cardiology) Ralene Bathe, MD (Dermatology)   Assessment:   This is a routine wellness examination for John Baxter.  Exercise Activities and Dietary recommendations Current Exercise Habits: Home exercise routine, Type of exercise: walking;Other - see comments(Walks dogs twice a day.), Time (Minutes): 20, Frequency (Times/Week): 7, Weekly Exercise (Minutes/Week): 140, Intensity: Mild, Exercise limited by: None identified  Goals    . DIET - INCREASE WATER INTAKE     Recommend to drink at least 6-8 8oz glasses of  water per day.       Fall Risk: Fall Risk  11/27/2019 10/26/2018 10/06/2017 08/05/2016  Falls in the past year? 0 0 No No  Number falls in past yr: 0 - - -  Injury with Fall? 0 - - -    FALL RISK PREVENTION PERTAINING TO THE HOME:  Any stairs in or around the home? Yes  If so, are there any without handrails? No   Home free of loose throw rugs in walkways, pet beds, electrical cords, etc? Yes  Adequate lighting in your home to reduce risk of falls? Yes   ASSISTIVE DEVICES UTILIZED TO PREVENT FALLS:  Life alert? No  Use of a cane, walker or w/c? No  Grab bars in the bathroom? No  Shower chair or bench in shower? No  Elevated toilet seat or a handicapped toilet? No   TIMED UP AND GO:  Was the test performed? No .    Depression Screen PHQ 2/9 Scores 11/27/2019 10/26/2018 10/06/2017 08/05/2016  PHQ - 2 Score 0 0 0 0  PHQ- 9 Score - 0 0 -    Cognitive Function: Declined today.         Immunization History  Administered Date(s) Administered  . Fluad Quad(high Dose 65+) 05/11/2019  . Influenza, High Dose Seasonal PF 06/24/2016, 07/16/2017, 06/02/2018  . Influenza,inj,Quad PF,6+ Mos 08/14/2015  . PFIZER SARS-COV-2 Vaccination 09/30/2019, 10/21/2019  . Pneumococcal Conjugate-13  08/05/2016  . Td 05/11/2019  . Tdap 09/11/2008    Qualifies for Shingles Vaccine? Yes . Due for Shingrix. Pt has been advised to call insurance company to determine out of pocket expense. Advised may also receive vaccine at local pharmacy or Health Dept. Verbalized acceptance and understanding.  Tdap: Up to date  Flu Vaccine: Up to date  Pneumococcal Vaccine: Due for Pneumococcal vaccine. Does the patient want to receive this vaccine today?  No . Advised may receive this vaccine at local pharmacy or Health Dept. Aware to provide a copy of the vaccination record if obtained from local pharmacy or Health Dept. Verbalized acceptance and understanding.   Screening Tests Health Maintenance  Topic Date Due  . Hepatitis C Screening  Never done  . PNA vac Low Risk Adult (2 of 2 - PPSV23) 08/05/2017  . INFLUENZA VACCINE  03/17/2020  . COLONOSCOPY  10/22/2020  . TETANUS/TDAP  05/10/2029   Cancer Screenings:  Colorectal Screening: Completed 10/23/15. Repeat every 5 years.   Lung Cancer Screening: (Low Dose CT Chest recommended if Age 55-80 years, 30 pack-year currently smoking OR have quit w/in 15years.) does not qualify.   Additional Screening:  Hepatitis C Screening: does qualify and would like to discuss this further with PCP at next in office apt.   Vision Screening: Recommended annual ophthalmology exams for early detection of glaucoma and other disorders of the eye.  Dental Screening: Recommended annual dental exams for proper oral hygiene  Community Resource Referral:  CRR required this visit?  No        Plan:  I have personally reviewed and addressed the Medicare Annual Wellness questionnaire and have noted the following in the patient's chart:  A. Medical and social history B. Use of alcohol, tobacco or illicit drugs  C. Current medications and supplements D. Functional ability and status E.  Nutritional status F.  Physical activity G. Advance directives H. List of other  physicians I.  Hospitalizations, surgeries, and ER visits in previous 12 months J.  [place] such as hearing and vision  if needed, cognitive and depression L. Referrals and appointments   In addition, I have reviewed and discussed with patient certain preventive protocols, quality metrics, and best practice recommendations. A written personalized care plan for preventive services as well as general preventive health recommendations were provided to patient.   Glendora Score, [place]  [id number] Nurse Health Advisor   Nurse Notes: Pt to discuss having the Hep lab completed and receiving the Pneumovax 23 vaccine at next in office apt with PCP.

## 2019-11-27 ENCOUNTER — Ambulatory Visit (INDEPENDENT_AMBULATORY_CARE_PROVIDER_SITE_OTHER): Payer: Medicare Other

## 2019-11-27 ENCOUNTER — Other Ambulatory Visit: Payer: Self-pay

## 2019-11-27 DIAGNOSIS — Z0000 Encounter for general adult medical examination without abnormal findings: Secondary | ICD-10-CM | POA: Diagnosis not present

## 2019-11-27 NOTE — Patient Instructions (Signed)
John Baxter , Thank you for taking time to come for your Medicare Wellness Visit. I appreciate your ongoing commitment to your health goals. Please review the following plan we discussed and let me know if I can assist you in the future.   Screening recommendations/referrals: Colonoscopy: Up to date, due 10/2020 Recommended yearly ophthalmology/optometry visit for glaucoma screening and checkup Recommended yearly dental visit for hygiene and checkup  Vaccinations: Influenza vaccine: Up to date Pneumococcal vaccine: Pneumovax 23 due Tdap vaccine: Up to date Shingles vaccine: Pt declines today.     Advanced directives: Please bring a copy of your POA (Power of Attorney) and/or Living Will to your next appointment.   Conditions/risks identified: Recommend to drink at least 6-8 8oz glasses of water per day.  Next appointment: 12/14/19 @ 9:40 AM with Dr Rosanna Randy . Declined scheduling an AWV for 2022 at this time.   Preventive Care 39 Years and Older, Male Preventive care refers to lifestyle choices and visits with your health care provider that can promote health and wellness. What does preventive care include?  A yearly physical exam. This is also called an annual well check.  Dental exams once or twice a year.  Routine eye exams. Ask your health care provider how often you should have your eyes checked.  Personal lifestyle choices, including:  Daily care of your teeth and gums.  Regular physical activity.  Eating a healthy diet.  Avoiding tobacco and drug use.  Limiting alcohol use.  Practicing safe sex.  Taking low doses of aspirin every day.  Taking vitamin and mineral supplements as recommended by your health care provider. What happens during an annual well check? The services and screenings done by your health care provider during your annual well check will depend on your age, overall health, lifestyle risk factors, and family history of disease. Counseling  Your  health care provider may ask you questions about your:  Alcohol use.  Tobacco use.  Drug use.  Emotional well-being.  Home and relationship well-being.  Sexual activity.  Eating habits.  History of falls.  Memory and ability to understand (cognition).  Work and work Statistician. Screening  You may have the following tests or measurements:  Height, weight, and BMI.  Blood pressure.  Lipid and cholesterol levels. These may be checked every 5 years, or more frequently if you are over 45 years old.  Skin check.  Lung cancer screening. You may have this screening every year starting at age 22 if you have a 30-pack-year history of smoking and currently smoke or have quit within the past 15 years.  Fecal occult blood test (FOBT) of the stool. You may have this test every year starting at age 15.  Flexible sigmoidoscopy or colonoscopy. You may have a sigmoidoscopy every 5 years or a colonoscopy every 10 years starting at age 53.  Prostate cancer screening. Recommendations will vary depending on your family history and other risks.  Hepatitis C blood test.  Hepatitis B blood test.  Sexually transmitted disease (STD) testing.  Diabetes screening. This is done by checking your blood sugar (glucose) after you have not eaten for a while (fasting). You may have this done every 1-3 years.  Abdominal aortic aneurysm (AAA) screening. You may need this if you are a current or former smoker.  Osteoporosis. You may be screened starting at age 7 if you are at high risk. Talk with your health care provider about your test results, treatment options, and if necessary, the need for  more tests. Vaccines  Your health care provider may recommend certain vaccines, such as:  Influenza vaccine. This is recommended every year.  Tetanus, diphtheria, and acellular pertussis (Tdap, Td) vaccine. You may need a Td booster every 10 years.  Zoster vaccine. You may need this after age  77.  Pneumococcal 13-valent conjugate (PCV13) vaccine. One dose is recommended after age 60.  Pneumococcal polysaccharide (PPSV23) vaccine. One dose is recommended after age 45. Talk to your health care provider about which screenings and vaccines you need and how often you need them. This information is not intended to replace advice given to you by your health care provider. Make sure you discuss any questions you have with your health care provider. Document Released: 08/30/2015 Document Revised: 04/22/2016 Document Reviewed: 06/04/2015 Elsevier Interactive Patient Education  2017 [employer] Prevention in the Home Falls can cause injuries. They can happen to people of all ages. There are many things you can do to make your home safe and to help prevent falls. What can I do on the outside of my home?  Regularly fix the edges of walkways and driveways and fix any cracks.  Remove anything that might make you trip as you walk through a door, such as a raised step or threshold.  Trim any bushes or trees on the path to your home.  Use bright outdoor lighting.  Clear any walking paths of anything that might make someone trip, such as rocks or tools.  Regularly check to see if handrails are loose or broken. Make sure that both sides of any steps have handrails.  Any raised decks and porches should have guardrails on the edges.  Have any leaves, snow, or ice cleared regularly.  Use sand or salt on walking paths during winter.  Clean up any spills in your garage right away. This includes oil or grease spills. What can I do in the bathroom?  Use night lights.  Install grab bars by the toilet and in the tub and shower. Do not use towel bars as grab bars.  Use non-skid mats or decals in the tub or shower.  If you need to sit down in the shower, use a plastic, non-slip stool.  Keep the floor dry. Clean up any water that spills on the floor as soon as it happens.  Remove  soap buildup in the tub or shower regularly.  Attach bath mats securely with double-sided non-slip rug tape.  Do not have throw rugs and other things on the floor that can make you trip. What can I do in the bedroom?  Use night lights.  Make sure that you have a light by your bed that is easy to reach.  Do not use any sheets or blankets that are too big for your bed. They should not hang down onto the floor.  Have a firm chair that has side arms. You can use this for support while you get dressed.  Do not have throw rugs and other things on the floor that can make you trip. What can I do in the kitchen?  Clean up any spills right away.  Avoid walking on wet floors.  Keep items that you use a lot in easy-to-reach places.  If you need to reach something above you, use a strong step stool that has a grab bar.  Keep electrical cords out of the way.  Do not use floor polish or wax that makes floors slippery. If you must use wax, use non-skid  floor wax.  Do not have throw rugs and other things on the floor that can make you trip. What can I do with my stairs?  Do not leave any items on the stairs.  Make sure that there are handrails on both sides of the stairs and use them. Fix handrails that are broken or loose. Make sure that handrails are as long as the stairways.  Check any carpeting to make sure that it is firmly attached to the stairs. Fix any carpet that is loose or worn.  Avoid having throw rugs at the top or bottom of the stairs. If you do have throw rugs, attach them to the floor with carpet tape.  Make sure that you have a light switch at the top of the stairs and the bottom of the stairs. If you do not have them, ask someone to add them for you. What else can I do to help prevent falls?  Wear shoes that:  Do not have high heels.  Have rubber bottoms.  Are comfortable and fit you well.  Are closed at the toe. Do not wear sandals.  If you use a  stepladder:  Make sure that it is fully opened. Do not climb a closed stepladder.  Make sure that both sides of the stepladder are locked into place.  Ask someone to hold it for you, if possible.  Clearly mark and make sure that you can see:  Any grab bars or handrails.  First and last steps.  Where the edge of each step is.  Use tools that help you move around (mobility aids) if they are needed. These include:  Canes.  Walkers.  Scooters.  Crutches.  Turn on the lights when you go into a dark area. Replace any light bulbs as soon as they burn out.  Set up your furniture so you have a clear path. Avoid moving your furniture around.  If any of your floors are uneven, fix them.  If there are any pets around you, be aware of where they are.  Review your medicines with your doctor. Some medicines can make you feel dizzy. This can increase your chance of falling. Ask your doctor what other things that you can do to help prevent falls. This information is not intended to replace advice given to you by your health care provider. Make sure you discuss any questions you have with your health care provider. Document Released: 05/30/2009 Document Revised: 01/09/2016 Document Reviewed: 09/07/2014 Elsevier Interactive Patient Education  2017 [employer].

## 2019-12-05 DIAGNOSIS — H168 Other keratitis: Secondary | ICD-10-CM | POA: Diagnosis not present

## 2019-12-05 DIAGNOSIS — H04129 Dry eye syndrome of unspecified lacrimal gland: Secondary | ICD-10-CM | POA: Diagnosis not present

## 2019-12-06 ENCOUNTER — Encounter: Payer: Self-pay | Admitting: Family Medicine

## 2019-12-11 NOTE — Progress Notes (Signed)
Complete physical exam   Patient: John Baxter   DOB: 1951-03-02   69 y.o. Male  MRN: [id number] Visit Date: 12/14/2019  Today's healthcare provider: [name], MD   Chief Complaint  Patient presents with  . Annual Exam   Subjective    John Baxter is a 69 y.o. male who presents today for a complete physical exam.  He reports consuming a general diet. Home exercise routine includes walking the dogs twice a day.Marland Kitchen He generally feels well. He reports sleeping well. He does not have additional problems to discuss today.   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  He is looking at another surgery for this.  He has decreased vision in his left eye.  He has had both Covid vaccines. Last colonoscopy: 10/23/2015 Past Medical History:  Diagnosis Date  . Actinic keratosis   . Hypertension    Past Surgical History:  Procedure Laterality Date  . CORNEAL TRANSPLANT    . TOE SURGERY     Ingrown nail x2   Social History   Socioeconomic History  . Marital status: Married    Spouse name: Not on file  . Number of children: 0  . Years of education: Not on file  . Highest education level: Master's degree (e.g., MA, MS, MEng, MEd, MSW, MBA)  Occupational History    Comment: Pastoral care pastor  Tobacco Use  . Smoking status: Never Smoker  . Smokeless tobacco: Never Used  Substance and Sexual Activity  . Alcohol use: Yes    Alcohol/week: 3.0 standard drinks    Types: 3 Cans of beer per week  . Drug use: No  . Sexual activity: Not on file  Other Topics Concern  . Not on file  Social History Narrative  . Not on file   Social Determinants of Health   Financial Resource Strain: Low Risk   . Difficulty of Paying Living Expenses: Not hard at all  Food Insecurity: No Food Insecurity  . Worried About Charity fundraiser in the Last Year: Never true  . Ran Out of Food in the Last Year: Never true    Transportation Needs: No Transportation Needs  . Lack of Transportation (Medical): No  . Lack of Transportation (Non-Medical): No  Physical Activity: Inactive  . Days of Exercise per Week: 0 days  . Minutes of Exercise per Session: 0 min  Stress: No Stress Concern Present  . Feeling of Stress : Not at all  Social Connections: Not Isolated  . Frequency of Communication with Friends and Family: More than three times a week  . Frequency of Social Gatherings with Friends and Family: Twice a week  . Attends Religious Services: More than 4 times per year  . Active Member of Clubs or Organizations: Yes  . Attends Archivist Meetings: More than 4 times per year  . Marital Status: Married  Human resources officer Violence: Not At Risk  . Fear of Current or Ex-Partner: No  . Emotionally Abused: No  . Physically Abused: No  . Sexually Abused: No   Family Status  Relation Name Status  . Mother  Deceased       died in 58. Cause of death was stroke  . Father  Deceased at age 47       Cause of death was lung cancer and CHF  . Sister  Alive  . Sister  Alive  Family History  Problem Relation Age of Onset  . Stroke Mother   . Arthritis Mother   . Heart disease Father   . Cancer Father        lung  . Blindness Father 20   No Known Allergies  Patient Care Team: Jerrol Banana., MD as PCP - General (Family Medicine) Ramonita Lab, Jovita Kussmaul, MD as Referring Physician (Ophthalmology) Emmaline Kluver., MD (Rheumatology) Corey Skains, MD as Consulting Physician (Cardiology) Ralene Bathe, MD (Dermatology)   Medications: Outpatient Medications Prior to Visit  Medication Sig  . acyclovir (ZOVIRAX) 400 MG tablet Take 400 mg by mouth 4 (four) times daily.   Marland Kitchen aspirin (ASPIRIN ADULT LOW STRENGTH) 81 MG chewable tablet Chew 81 mg by mouth daily.   . folic acid (FOLVITE) 1 MG tablet Take 1 mg by mouth daily.   . Ganciclovir (ZIRGAN) 0.15 % GEL Apply 1 drop to  eye daily.   Marland Kitchen HUMIRA PEN 40 MG/0.4ML PNKT Inject 1 pen/syringe subcutaneously every other week  . levothyroxine (SYNTHROID) 100 MCG tablet TAKE 1 TABLET BY MOUTH EVERY DAY  . methotrexate 2.5 MG tablet Take 10 mg by mouth. Takes 4 tablets once a week  . metoprolol succinate (TOPROL XL) 25 MG 24 hr tablet Take 25 mg by mouth 2 (two) times daily.   . OPHTHALMIC IRRIGATION SOLUTION OP Apply to eye in the morning, at noon, in the evening, and at bedtime. Plasma rich growth factor ophthalmic solution from [facility]  . prednisoLONE acetate (PRED FORTE) 1 % ophthalmic suspension Place 1 drop into the left eye daily.   . RESTASIS 0.05 % ophthalmic emulsion Place 1 drop into the right eye 3 (three) times daily.   . fluorometholone (FML) 0.1 % ophthalmic suspension SHAKE LQ AND INT 1 GTT IN OS QID  . mometasone (ELOCON) 0.1 % cream APP 1 APPLINCATION ON THE AFFECTED NECK SKIN AREA QD UP TO 5 DAYS PER WK UNTIL CLEAR UTD  . predniSONE (STERAPRED UNI-PAK 21 TAB) 10 MG (21) TBPK tablet Taper as directed on the package (6,5,4,3,2,1) (Patient not taking: Reported on 11/27/2019)   No facility-administered medications prior to visit.    Review of Systems  Constitutional: Negative.   Eyes: Positive for visual disturbance.  Respiratory: Negative.   Cardiovascular: Negative.   Gastrointestinal: Negative.   Endocrine: Negative.   Genitourinary: Negative.   Musculoskeletal: Negative.   Skin: Negative.   Allergic/Immunologic: Negative.   Neurological: Negative.   Hematological: Negative.   Psychiatric/Behavioral: Negative.        Objective    BP 109/72 (BP Location: Left Arm, Patient Position: Sitting, Cuff Size: Normal)   Pulse 81   Temp (!) 96.8 F (36 C) (Temporal)   Ht 5\' 10"  (1.778 m)   Wt 216 lb 9.6 oz (98.2 kg)   BMI 31.08 kg/m  BP Readings from Last 3 Encounters:  12/14/19 109/72  05/11/19 108/62  01/16/19 110/72   Wt Readings from Last 3 Encounters:  12/14/19 216 lb 9.6 oz (98.2 kg)    05/11/19 216 lb (98 kg)  01/16/19 213 lb (96.6 kg)      Physical Exam Vitals reviewed.  Constitutional:      Appearance: He is well-developed.  HENT:     Head: Normocephalic and atraumatic.     Right Ear: External ear normal.     Left Ear: External ear normal.     Nose: Nose normal.     Mouth/Throat:     Pharynx:  Oropharynx is clear.  Eyes:     General: No scleral icterus.    Conjunctiva/sclera: Conjunctivae normal.  Neck:     Thyroid: No thyromegaly.  Cardiovascular:     Rate and Rhythm: Normal rate and regular rhythm.     Heart sounds: Normal heart sounds.  Pulmonary:     Effort: Pulmonary effort is normal.     Breath sounds: Normal breath sounds.  Abdominal:     Palpations: Abdomen is soft.  Genitourinary:    Penis: Normal.      Testes: Normal.     Prostate: Normal.     Rectum: Normal.  Musculoskeletal:     Right lower leg: No edema.     Left lower leg: No edema.  Skin:    General: Skin is warm and dry.     Comments: Very fair skin  Neurological:     General: No focal deficit present.     Mental Status: He is alert and oriented to person, place, and time.  Psychiatric:        Mood and Affect: Mood normal.        Behavior: Behavior normal.        Thought Content: Thought content normal.        Judgment: Judgment normal.       Depression Screen  PHQ 2/9 Scores 11/27/2019 10/26/2018 10/06/2017  PHQ - 2 Score 0 0 0  PHQ- 9 Score - 0 0    No results found for any visits on 12/14/19.  Assessment & Plan    Routine Health Maintenance and Physical Exam  Exercise Activities and Dietary recommendations Goals    . DIET - INCREASE WATER INTAKE     Recommend to drink at least 6-8 8oz glasses of water per day.       Immunization History  Administered Date(s) Administered  . Fluad Quad(high Dose 65+) 05/11/2019  . Influenza, High Dose Seasonal PF 06/24/2016, 07/16/2017, 06/02/2018  . Influenza,inj,Quad PF,6+ Mos 08/14/2015  . PFIZER SARS-COV-2 Vaccination  09/30/2019, 10/21/2019  . Pneumococcal Conjugate-13 08/05/2016  . Td 05/11/2019  . Tdap 09/11/2008    Health Maintenance  Topic Date Due  . Hepatitis C Screening  Never done  . PNA vac Low Risk Adult (2 of 2 - PPSV23) 08/05/2017  . INFLUENZA VACCINE  03/17/2020  . COLONOSCOPY  10/22/2020  . TETANUS/TDAP  05/10/2029  . COVID-19 Vaccine  Completed    Discussed health benefits of physical activity, and encouraged him to engage in regular exercise appropriate for his age and condition.  1. Annual physical exam  - CBC with Differential/Platelet - Comprehensive metabolic panel - TSH - Lipid panel - PSA  2. Prostate cancer screening  - CBC with Differential/Platelet - Comprehensive metabolic panel - TSH - Lipid panel - PSA  3. Encounter for screening fecal occult blood testing  - IFOBT POC (occult bld, rslt in office); Future - IFOBT POC (occult bld, rslt in office)  4. Rheumatoid arthritis involving both hands, unspecified whether rheumatoid factor present ([place]) Now involving the eye.  Followed at [facility] - CBC with Differential/Platelet - Comprehensive metabolic panel - TSH - Lipid panel - PSA  5. Adult hypothyroidism  - CBC with Differential/Platelet - Comprehensive metabolic panel - TSH - Lipid panel - PSA 6.  Proximal  atrial fib 7.  OSA  No follow-ups on file.     I, [name], MD, have reviewed all documentation for this visit. The documentation on 12/18/19 for the exam, diagnosis, procedures,  and orders are all accurate and complete.    Oaklynn Stierwalt Cranford Mon, MD  [facility] [phone] (phone) [phone] (fax)  [place]

## 2019-12-11 NOTE — Progress Notes (Deleted)
     Established patient visit   Patient: John Baxter   DOB: 12/09/1950   69 y.o. Male  MRN: [id number] Visit Date: 12/11/2019  Today's healthcare provider: [name], MD   No chief complaint on file.  Subjective    HPI ***  {Show patient history (optional):23778::" "}   Medications: Outpatient Medications Prior to Visit  Medication Sig  . acyclovir (ZOVIRAX) 400 MG tablet Take 400 mg by mouth 5 (five) times daily.  Marland Kitchen aspirin (ASPIRIN ADULT LOW STRENGTH) 81 MG chewable tablet Chew 81 mg by mouth daily.   . fluorometholone (FML) 0.1 % ophthalmic suspension SHAKE LQ AND INT 1 GTT IN OS QID  . folic acid (FOLVITE) 1 MG tablet Take 1 mg by mouth daily.   . Ganciclovir (ZIRGAN) 0.15 % GEL Apply 1 drop to eye daily.   Marland Kitchen HUMIRA PEN 40 MG/0.4ML PNKT Inject 1 pen/syringe subcutaneously every other week  . levothyroxine (SYNTHROID) 100 MCG tablet TAKE 1 TABLET BY MOUTH EVERY DAY  . methotrexate 2.5 MG tablet Take 10 mg by mouth. Takes 4 tablets once a week  . metoprolol succinate (TOPROL XL) 25 MG 24 hr tablet Take 25 mg by mouth 2 (two) times daily.   . mometasone (ELOCON) 0.1 % cream APP 1 APPLINCATION ON THE AFFECTED NECK SKIN AREA QD UP TO 5 DAYS PER WK UNTIL CLEAR UTD  . OPHTHALMIC IRRIGATION SOLUTION OP Apply to eye in the morning, at noon, in the evening, and at bedtime. Plasma rich growth factor ophthalmic solution from [facility]  . prednisoLONE acetate (PRED FORTE) 1 % ophthalmic suspension Place 1 drop into the left eye daily.   . predniSONE (STERAPRED UNI-PAK 21 TAB) 10 MG (21) TBPK tablet Taper as directed on the package (6,5,4,3,2,1) (Patient not taking: Reported on 11/27/2019)  . RESTASIS 0.05 % ophthalmic emulsion Place 1 drop into the right eye 3 (three) times daily.    No facility-administered medications prior to visit.    Review of Systems  {Show previous labs (optional):23779::" "}   Objective    There were no vitals taken for this visit. {Show previous  vital signs (optional):23777::" "}  Physical Exam  ***  No results found for any visits on 12/14/19.  Assessment & Plan    ***  No follow-ups on file.      {provider attestation***:1}   [name], MD  [facility] [phone] (phone) [phone] (fax)  [place]

## 2019-12-14 ENCOUNTER — Other Ambulatory Visit: Payer: Self-pay

## 2019-12-14 ENCOUNTER — Ambulatory Visit (INDEPENDENT_AMBULATORY_CARE_PROVIDER_SITE_OTHER): Payer: Medicare Other | Admitting: Family Medicine

## 2019-12-14 ENCOUNTER — Encounter: Payer: Self-pay | Admitting: Family Medicine

## 2019-12-14 VITALS — BP 109/72 | HR 81 | Temp 96.8°F | Ht 70.0 in | Wt 216.6 lb

## 2019-12-14 DIAGNOSIS — M069 Rheumatoid arthritis, unspecified: Secondary | ICD-10-CM

## 2019-12-14 DIAGNOSIS — I4811 Longstanding persistent atrial fibrillation: Secondary | ICD-10-CM

## 2019-12-14 DIAGNOSIS — Z0000 Encounter for general adult medical examination without abnormal findings: Secondary | ICD-10-CM | POA: Diagnosis not present

## 2019-12-14 DIAGNOSIS — E039 Hypothyroidism, unspecified: Secondary | ICD-10-CM | POA: Diagnosis not present

## 2019-12-14 DIAGNOSIS — Z125 Encounter for screening for malignant neoplasm of prostate: Secondary | ICD-10-CM

## 2019-12-14 DIAGNOSIS — Z1211 Encounter for screening for malignant neoplasm of colon: Secondary | ICD-10-CM | POA: Diagnosis not present

## 2019-12-14 LAB — IFOBT (OCCULT BLOOD): IFOBT: NEGATIVE

## 2019-12-15 ENCOUNTER — Telehealth: Payer: Self-pay

## 2019-12-15 LAB — COMPREHENSIVE METABOLIC PANEL
ALT: 74 IU/L — ABNORMAL HIGH (ref 0–44)
AST: 56 IU/L — ABNORMAL HIGH (ref 0–40)
Albumin/Globulin Ratio: 1.3 (ref 1.2–2.2)
Albumin: 4.1 g/dL (ref 3.8–4.8)
Alkaline Phosphatase: 79 IU/L (ref 39–117)
BUN/Creatinine Ratio: 16 (ref 10–24)
BUN: 15 mg/dL (ref 8–27)
Bilirubin Total: 1.2 mg/dL (ref 0.0–1.2)
CO2: 23 mmol/L (ref 20–29)
Calcium: 9.1 mg/dL (ref 8.6–10.2)
Chloride: 101 mmol/L (ref 96–106)
Creatinine, Ser: 0.94 mg/dL (ref 0.76–1.27)
GFR calc Af Amer: 95 mL/min/{1.73_m2} (ref >59)
GFR calc non Af Amer: 82 mL/min/{1.73_m2} (ref >59)
Globulin, Total: 3.1 g/dL (ref 1.5–4.5)
Glucose: 87 mg/dL (ref 65–99)
Potassium: 4.5 mmol/L (ref 3.5–5.2)
Sodium: 136 mmol/L (ref 134–144)
Total Protein: 7.2 g/dL (ref 6.0–8.5)

## 2019-12-15 LAB — CBC WITH DIFFERENTIAL/PLATELET
Basophils Absolute: 0.1 10*3/uL (ref 0.0–0.2)
Basos: 1 %
EOS (ABSOLUTE): 0.1 10*3/uL (ref 0.0–0.4)
Eos: 3 %
Hematocrit: 44.4 % (ref 37.5–51.0)
Hemoglobin: 15.4 g/dL (ref 13.0–17.7)
Immature Grans (Abs): 0 10*3/uL (ref 0.0–0.1)
Immature Granulocytes: 0 %
Lymphocytes Absolute: 1.9 10*3/uL (ref 0.7–3.1)
Lymphs: 37 %
MCH: 34.9 pg — ABNORMAL HIGH (ref 26.6–33.0)
MCHC: 34.7 g/dL (ref 31.5–35.7)
MCV: 101 fL — ABNORMAL HIGH (ref 79–97)
Monocytes Absolute: 0.5 10*3/uL (ref 0.1–0.9)
Monocytes: 10 %
Neutrophils Absolute: 2.5 10*3/uL (ref 1.4–7.0)
Neutrophils: 49 %
Platelets: 244 10*3/uL (ref 150–450)
RBC: 4.41 x10E6/uL (ref 4.14–5.80)
RDW: 13.6 % (ref 11.6–15.4)
WBC: 5.1 10*3/uL (ref 3.4–10.8)

## 2019-12-15 LAB — LIPID PANEL
Chol/HDL Ratio: 2.9 ratio (ref 0.0–5.0)
Cholesterol, Total: 166 mg/dL (ref 100–199)
HDL: 57 mg/dL (ref >39)
LDL Chol Calc (NIH): 96 mg/dL (ref 0–99)
Triglycerides: 69 mg/dL (ref 0–149)
VLDL Cholesterol Cal: 13 mg/dL (ref 5–40)

## 2019-12-15 LAB — TSH: TSH: 3.25 u[IU]/mL (ref 0.450–4.500)

## 2019-12-15 LAB — PSA: Prostate Specific Ag, Serum: 2.8 ng/mL (ref 0.0–4.0)

## 2019-12-15 NOTE — Telephone Encounter (Signed)
Patient advised.

## 2019-12-15 NOTE — Telephone Encounter (Signed)
-----   Message from Jerrol Banana., MD sent at 12/15/2019  8:41 AM EDT ----- Labs in normal range with minimal elevation of liver function test.  Work on diet and exercise.  Lets repeat CBC and hepatic panel in 2 to 3 months.

## 2019-12-19 DIAGNOSIS — Z947 Corneal transplant status: Secondary | ICD-10-CM | POA: Diagnosis not present

## 2019-12-19 DIAGNOSIS — H16002 Unspecified corneal ulcer, left eye: Secondary | ICD-10-CM | POA: Diagnosis not present

## 2020-01-03 DIAGNOSIS — G4733 Obstructive sleep apnea (adult) (pediatric): Secondary | ICD-10-CM | POA: Diagnosis not present

## 2020-01-09 DIAGNOSIS — Z460 Encounter for fitting and adjustment of spectacles and contact lenses: Secondary | ICD-10-CM | POA: Diagnosis not present

## 2020-01-09 DIAGNOSIS — H16002 Unspecified corneal ulcer, left eye: Secondary | ICD-10-CM | POA: Diagnosis not present

## 2020-01-09 DIAGNOSIS — H168 Other keratitis: Secondary | ICD-10-CM | POA: Diagnosis not present

## 2020-01-12 DIAGNOSIS — M0579 Rheumatoid arthritis with rheumatoid factor of multiple sites without organ or systems involvement: Secondary | ICD-10-CM | POA: Diagnosis not present

## 2020-01-30 DIAGNOSIS — H04129 Dry eye syndrome of unspecified lacrimal gland: Secondary | ICD-10-CM | POA: Diagnosis not present

## 2020-02-03 DIAGNOSIS — G4733 Obstructive sleep apnea (adult) (pediatric): Secondary | ICD-10-CM | POA: Diagnosis not present

## 2020-02-06 DIAGNOSIS — H16002 Unspecified corneal ulcer, left eye: Secondary | ICD-10-CM | POA: Diagnosis not present

## 2020-02-06 DIAGNOSIS — Z947 Corneal transplant status: Secondary | ICD-10-CM | POA: Diagnosis not present

## 2020-02-06 DIAGNOSIS — H04129 Dry eye syndrome of unspecified lacrimal gland: Secondary | ICD-10-CM | POA: Diagnosis not present

## 2020-02-06 DIAGNOSIS — H532 Diplopia: Secondary | ICD-10-CM | POA: Diagnosis not present

## 2020-02-06 DIAGNOSIS — H5021 Vertical strabismus, right eye: Secondary | ICD-10-CM | POA: Diagnosis not present

## 2020-02-14 DIAGNOSIS — H18892 Other specified disorders of cornea, left eye: Secondary | ICD-10-CM | POA: Diagnosis not present

## 2020-02-14 DIAGNOSIS — H16002 Unspecified corneal ulcer, left eye: Secondary | ICD-10-CM | POA: Diagnosis not present

## 2020-02-23 DIAGNOSIS — E782 Mixed hyperlipidemia: Secondary | ICD-10-CM | POA: Diagnosis not present

## 2020-02-23 DIAGNOSIS — I482 Chronic atrial fibrillation, unspecified: Secondary | ICD-10-CM | POA: Diagnosis not present

## 2020-02-23 DIAGNOSIS — G4733 Obstructive sleep apnea (adult) (pediatric): Secondary | ICD-10-CM | POA: Diagnosis not present

## 2020-02-23 DIAGNOSIS — I428 Other cardiomyopathies: Secondary | ICD-10-CM | POA: Diagnosis not present

## 2020-02-23 DIAGNOSIS — I071 Rheumatic tricuspid insufficiency: Secondary | ICD-10-CM | POA: Diagnosis not present

## 2020-02-26 ENCOUNTER — Ambulatory Visit: Payer: Medicare Other | Admitting: Dermatology

## 2020-03-04 DIAGNOSIS — G4733 Obstructive sleep apnea (adult) (pediatric): Secondary | ICD-10-CM | POA: Diagnosis not present

## 2020-03-05 DIAGNOSIS — H16002 Unspecified corneal ulcer, left eye: Secondary | ICD-10-CM | POA: Diagnosis not present

## 2020-03-05 DIAGNOSIS — H18892 Other specified disorders of cornea, left eye: Secondary | ICD-10-CM | POA: Diagnosis not present

## 2020-03-05 DIAGNOSIS — Z460 Encounter for fitting and adjustment of spectacles and contact lenses: Secondary | ICD-10-CM | POA: Diagnosis not present

## 2020-03-05 DIAGNOSIS — Z947 Corneal transplant status: Secondary | ICD-10-CM | POA: Diagnosis not present

## 2020-03-05 DIAGNOSIS — H04129 Dry eye syndrome of unspecified lacrimal gland: Secondary | ICD-10-CM | POA: Diagnosis not present

## 2020-03-19 DIAGNOSIS — Z460 Encounter for fitting and adjustment of spectacles and contact lenses: Secondary | ICD-10-CM | POA: Diagnosis not present

## 2020-03-19 DIAGNOSIS — Z947 Corneal transplant status: Secondary | ICD-10-CM | POA: Diagnosis not present

## 2020-03-19 DIAGNOSIS — H16002 Unspecified corneal ulcer, left eye: Secondary | ICD-10-CM | POA: Diagnosis not present

## 2020-03-20 ENCOUNTER — Ambulatory Visit: Payer: Medicare Other | Admitting: Dermatology

## 2020-03-20 ENCOUNTER — Other Ambulatory Visit: Payer: Self-pay

## 2020-03-20 DIAGNOSIS — L578 Other skin changes due to chronic exposure to nonionizing radiation: Secondary | ICD-10-CM

## 2020-03-20 DIAGNOSIS — L570 Actinic keratosis: Secondary | ICD-10-CM | POA: Diagnosis not present

## 2020-03-20 NOTE — Progress Notes (Signed)
   Follow-Up Visit   Subjective  John Baxter is a 69 y.o. male who presents for the following: Actinic Keratosis (40m f/u Face, arms, hands).  The following portions of the chart were reviewed this encounter and updated as appropriate:  Tobacco  Allergies  Meds  Problems  Med Hx  Surg Hx  Fam Hx     Review of Systems:  No other skin or systemic complaints except as noted in HPI or Assessment and Plan.  Objective  Well appearing patient in no apparent distress; mood and affect are within normal limits.  A focused examination was performed including face, arms, hands. Relevant physical exam findings are noted in the Assessment and Plan.  Objective  L hand x 1, R hand x 2, R temple x 1, R ear x 2, R sideburn x 1, L temple x 2, L zygoma x 1, L ear x 1 (11): Pink scaly macules    Assessment & Plan    Actinic Damage - diffuse scaly erythematous macules with underlying dyspigmentation - Recommend daily broad spectrum sunscreen SPF 30+ to sun-exposed areas, reapply every 2 hours as needed.  - Call for new or changing lesions.  AK (actinic keratosis) (11) L hand x 1, R hand x 2, R temple x 1, R ear x 2, R sideburn x 1, L temple x 2, L zygoma x 1, L ear x 1  May consider field txt in the future topical vs PDT with ALA, inf on ALA given.  Destruction of lesion - L hand x 1, R hand x 2, R temple x 1, R ear x 2, R sideburn x 1, L temple x 2, L zygoma x 1, L ear x 1 Complexity: simple   Destruction method: cryotherapy   Informed consent: discussed and consent obtained   Timeout:  patient name, date of birth, surgical site, and procedure verified Lesion destroyed using liquid nitrogen: Yes   Region frozen until ice ball extended beyond lesion: Yes   Outcome: patient tolerated procedure well with no complications   Post-procedure details: wound care instructions given    Return in about 6 months (around 09/20/2020) for AK f/u, face, arms, hands.  I, Othelia Pulling, RMA, am  acting as scribe for Sarina Ser, MD .  Documentation: I have reviewed the above documentation for accuracy and completeness, and I agree with the above.  Sarina Ser, MD

## 2020-03-20 NOTE — Patient Instructions (Signed)
Actinic Keratosis  What is an actinic keratosis? An actinic keratosis (plural: actinic keratoses) is growth on the surface of the skin that usually appears as a red, hard, crusty or scaly bump.  What causes actinic keratoses? Repeated prolonged sun exposure causes skin damage, especially in fair-skinned persons. Sun-damaged skin becomes dry and wrinkled and may form rough, scaly spots called actinic keratoses. These rough spots remain on the skin even though the crust or scale on top is picked off.  Why treat actinic keratoses? Actinic keratoses are not skin cancers, but because they may sometimes turn cancerous they are called "pre-cancerous". Not all will turn to skin cancer, and it usually takes several years for this to happen. Because it is much easier to treat an actinic keratosis then it is to remove a skin cancer, actinic keratoses should be treated to prevent future skin cancer.  How are actinic keratoses treated? The most common way of treating actinic keratoses is to freeze them with liquid nitrogen. Freezing causes scabbing and shedding of the sun-damaged skin. Healing after a removal usually takes two weeks, depending on the size and location of the keratosis. Hands and legs heal more slowly than the face. The skin's final appearance is usually excellent. There are several topical medications that can be used to treat actinic keratoses. These medications generally have side effects of redness, crusting, and pain.Some are used for a few days, and some for several months before the actinic keratosis is completely gone. Photodynamic therapy is another alternative to freezing actinic keratoses.This treatment is done in a physician's office.A medication is applied to the area of skin with actinic keratoses, and it is allowed to soak in for one or more hours. A special light is then applied to the skin.Side effects include redness, burning, and peeling.  How can you prevent actinic  keratoses? Protection from the sun is the best way to prevent actinic keratoses.The use of proper clothing and sunscreens can prevent the sun damage that leads to an actinic keratosis. Unfortunately, some sun damage is permanent. Once sun damage has progressed to the point where actinic keratoses develop, new keratoses may appear even without further sun exposure. However, even in skin that is already heavily sun damaged, good sun protection can help reduce the number of actinic keratoses that will appear.   

## 2020-03-21 ENCOUNTER — Encounter: Payer: Self-pay | Admitting: Dermatology

## 2020-04-04 DIAGNOSIS — G4733 Obstructive sleep apnea (adult) (pediatric): Secondary | ICD-10-CM | POA: Diagnosis not present

## 2020-04-12 DIAGNOSIS — M0579 Rheumatoid arthritis with rheumatoid factor of multiple sites without organ or systems involvement: Secondary | ICD-10-CM | POA: Diagnosis not present

## 2020-04-29 DIAGNOSIS — I4891 Unspecified atrial fibrillation: Secondary | ICD-10-CM | POA: Diagnosis not present

## 2020-04-29 DIAGNOSIS — Z79899 Other long term (current) drug therapy: Secondary | ICD-10-CM | POA: Diagnosis not present

## 2020-04-29 DIAGNOSIS — M0579 Rheumatoid arthritis with rheumatoid factor of multiple sites without organ or systems involvement: Secondary | ICD-10-CM | POA: Diagnosis not present

## 2020-04-29 DIAGNOSIS — H16002 Unspecified corneal ulcer, left eye: Secondary | ICD-10-CM | POA: Diagnosis not present

## 2020-04-29 DIAGNOSIS — I429 Cardiomyopathy, unspecified: Secondary | ICD-10-CM | POA: Diagnosis not present

## 2020-05-05 DIAGNOSIS — G4733 Obstructive sleep apnea (adult) (pediatric): Secondary | ICD-10-CM | POA: Diagnosis not present

## 2020-05-11 DIAGNOSIS — G4733 Obstructive sleep apnea (adult) (pediatric): Secondary | ICD-10-CM | POA: Diagnosis not present

## 2020-05-22 DIAGNOSIS — H16002 Unspecified corneal ulcer, left eye: Secondary | ICD-10-CM | POA: Diagnosis not present

## 2020-05-22 DIAGNOSIS — Z947 Corneal transplant status: Secondary | ICD-10-CM | POA: Diagnosis not present

## 2020-05-22 DIAGNOSIS — H18892 Other specified disorders of cornea, left eye: Secondary | ICD-10-CM | POA: Diagnosis not present

## 2020-06-04 DIAGNOSIS — G4733 Obstructive sleep apnea (adult) (pediatric): Secondary | ICD-10-CM | POA: Diagnosis not present

## 2020-06-06 ENCOUNTER — Ambulatory Visit (INDEPENDENT_AMBULATORY_CARE_PROVIDER_SITE_OTHER): Payer: Medicare Other

## 2020-06-06 ENCOUNTER — Other Ambulatory Visit: Payer: Self-pay

## 2020-06-06 DIAGNOSIS — Z23 Encounter for immunization: Secondary | ICD-10-CM | POA: Diagnosis not present

## 2020-06-13 NOTE — Progress Notes (Deleted)
     Established patient visit   Patient: John Baxter   DOB: 1951/07/30   69 y.o. Male  MRN: [id number] Visit Date: 06/17/2020  Today's healthcare provider: [name], MD   No chief complaint on file.  Subjective    HPI  Hypothyroid, follow-up  Lab Results  Component Value Date   TSH 3.250 12/14/2019   TSH 3.160 10/26/2018   TSH 2.630 04/20/2018   Wt Readings from Last 3 Encounters:  12/14/19 216 lb 9.6 oz (98.2 kg)  05/11/19 216 lb (98 kg)  01/16/19 213 lb (96.6 kg)    He was last seen for hypothyroid 6 months ago.  Management since that visit includes; labs checked showing-normal range with minimal elevation of liver function test. Work on diet and exercise. Lets repeat CBC and hepatic panel in 2 to 3 months. Also consider getting B12 with those labs in follow-up He reports {excellent/good/fair/poor:19665} compliance with treatment. He {is/is not:21021397} having side effects. {document side effects if present:1}  -----------------------------------------------------------------------------------------   {Show patient history (optional):23778::" "}   Medications: Outpatient Medications Prior to Visit  Medication Sig  . acyclovir (ZOVIRAX) 400 MG tablet Take 400 mg by mouth 4 (four) times daily.   Marland Kitchen aspirin (ASPIRIN ADULT LOW STRENGTH) 81 MG chewable tablet Chew 81 mg by mouth daily.   . fluorometholone (FML) 0.1 % ophthalmic suspension SHAKE LQ AND INT 1 GTT IN OS QID  . folic acid (FOLVITE) 1 MG tablet Take 1 mg by mouth daily.   . Ganciclovir (ZIRGAN) 0.15 % GEL Apply 1 drop to eye daily.   Marland Kitchen HUMIRA PEN 40 MG/0.4ML PNKT Inject 1 pen/syringe subcutaneously every other week  . levothyroxine (SYNTHROID) 100 MCG tablet TAKE 1 TABLET BY MOUTH EVERY DAY  . methotrexate 2.5 MG tablet Take 10 mg by mouth. Takes 4 tablets once a week  . metoprolol succinate (TOPROL XL) 25 MG 24 hr tablet Take 25 mg by mouth 2 (two) times daily.   . mometasone (ELOCON) 0.1  % cream APP 1 APPLINCATION ON THE AFFECTED NECK SKIN AREA QD UP TO 5 DAYS PER WK UNTIL CLEAR UTD  . OPHTHALMIC IRRIGATION SOLUTION OP Apply to eye in the morning, at noon, in the evening, and at bedtime. Plasma rich growth factor ophthalmic solution from [facility]  . prednisoLONE acetate (PRED FORTE) 1 % ophthalmic suspension Place 1 drop into the left eye daily.   . predniSONE (STERAPRED UNI-PAK 21 TAB) 10 MG (21) TBPK tablet Taper as directed on the package (6,5,4,3,2,1) (Patient not taking: Reported on 11/27/2019)  . RESTASIS 0.05 % ophthalmic emulsion Place 1 drop into the right eye 3 (three) times daily.    No facility-administered medications prior to visit.    Review of Systems  Constitutional: Negative for appetite change, chills and fever.  Respiratory: Negative for chest tightness, shortness of breath and wheezing.   Cardiovascular: Negative for chest pain and palpitations.  Gastrointestinal: Negative for abdominal pain, nausea and vomiting.    {Heme  Chem  Endocrine  Serology  Results Review (optional):23779::" "}  Objective    There were no vitals taken for this visit. {Show previous vital signs (optional):23777::" "}  Physical Exam  ***  No results found for any visits on 06/17/20.  Assessment & Plan     ***  No follow-ups on file.      {provider attestation***:1}   [name], MD  [facility] [phone] (phone) [phone] (fax)  [place]

## 2020-06-14 ENCOUNTER — Ambulatory Visit (INDEPENDENT_AMBULATORY_CARE_PROVIDER_SITE_OTHER): Payer: Medicare Other | Admitting: Family Medicine

## 2020-06-14 DIAGNOSIS — J040 Acute laryngitis: Secondary | ICD-10-CM | POA: Diagnosis not present

## 2020-06-14 MED ORDER — PROMETHAZINE-DM 6.25-15 MG/5ML PO SYRP: 5.0000 mL | ORAL_SOLUTION | Freq: Four times a day (QID) | ORAL | 0 refills | Status: DC | PRN

## 2020-06-14 NOTE — Progress Notes (Signed)
Virtual telephone visit    Virtual Visit via Telephone Note   This visit type was conducted due to national recommendations for restrictions regarding the COVID-19 Pandemic (e.g. social distancing) in an effort to limit this patient's exposure and mitigate transmission in our community. Due to his co-morbid illnesses, this patient is at least at moderate risk for complications without adequate follow up. This format is felt to be most appropriate for this patient at this time. The patient did not have access to video technology or had technical difficulties with video requiring transitioning to audio format only (telephone). Physical exam was limited to content and character of the telephone converstion.    Patient location: home Provider location: office  I discussed the limitations of evaluation and management by telemedicine and the availability of in person appointments. The patient expressed understanding and agreed to proceed.   Visit Date: 06/14/2020  Today's healthcare provider: Vernie Murders, PA   No chief complaint on file.  Subjective    HPI  Patient is a 69 year old male who presents via phone visit with complaint of laryngitis.  He states it started about 2 days ago after he had to be in the night air in the middle of the night for some time.  He has tried 2 doses of Mucinex.  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   He has no other real complaints other than a little bit of a cough that is like a tickle in his throat.     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 lat. neck. Hypertrophic.   Hypertension    Past Surgical History:  Procedure Laterality Date   CORNEAL TRANSPLANT     TOE SURGERY     Ingrown nail x2   Social History   Tobacco Use   Smoking status: Never Smoker   Smokeless tobacco: Never Used  Vaping Use    Vaping Use: Never used  Substance Use Topics   Alcohol use: Yes    Alcohol/week: 3.0 standard drinks    Types: 3 Cans of beer per week   Drug use: No   Family Status  Relation Name Status   Mother  Deceased       died in 3. Cause of death was stroke   Father  Deceased at age 79       Cause of death was lung cancer and CHF   Sister  Alive   Sister  Alive   No Known Allergies    Medications: Outpatient Medications Prior to Visit  Medication Sig   acyclovir (ZOVIRAX) 400 MG tablet Take 400 mg by mouth 4 (four) times daily.    aspirin (ASPIRIN ADULT LOW STRENGTH) 81 MG chewable tablet Chew 81 mg by mouth daily.    fluorometholone (FML) 0.1 % ophthalmic suspension SHAKE LQ AND INT 1 GTT IN OS QID   folic acid (FOLVITE) 1 MG tablet Take 1 mg by mouth daily.    Ganciclovir (ZIRGAN) 0.15 % GEL Apply 1 drop to eye daily.    HUMIRA PEN 40 MG/0.4ML PNKT Inject 1 pen/syringe subcutaneously every other week   levothyroxine (SYNTHROID) 100 MCG tablet TAKE 1 TABLET BY MOUTH EVERY DAY   methotrexate 2.5 MG tablet Take 10 mg by mouth. Takes 4 tablets once a week   metoprolol succinate (TOPROL XL) 25 MG 24 hr tablet  Take 25 mg by mouth 2 (two) times daily.    mometasone (ELOCON) 0.1 % cream APP 1 APPLINCATION ON THE AFFECTED NECK SKIN AREA QD UP TO 5 DAYS PER WK UNTIL CLEAR UTD   OPHTHALMIC IRRIGATION SOLUTION OP Apply to eye in the morning, at noon, in the evening, and at bedtime. Plasma rich growth factor ophthalmic solution from [facility]   prednisoLONE acetate (PRED FORTE) 1 % ophthalmic suspension Place 1 drop into the left eye daily.    predniSONE (STERAPRED UNI-PAK 21 TAB) 10 MG (21) TBPK tablet Taper as directed on the package (6,5,4,3,2,1) (Patient not taking: Reported on 11/27/2019)   RESTASIS 0.05 % ophthalmic emulsion Place 1 drop into the right eye 3 (three) times daily.    No facility-administered medications prior to visit.    Review of Systems  Constitutional:  Negative for chills, fatigue and fever.  HENT: Negative for congestion, ear discharge, postnasal drip, rhinorrhea, sinus pressure, sinus pain, sneezing and sore throat.        Hoarseness  Respiratory: Positive for cough. Negative for shortness of breath.       Objective    There were no vitals taken for this visit.     Assessment & Plan     1. Laryngitis Developed a weak voice and hoarseness 2 days ago. 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 No fever, loss of taste, dyspnea, wheeze, headache or fever. Will give promethazine-DM syrup to gargle and swallow QID. Increase fluids and try not to overuse his voice in preparation for the funeral. Recheck prn. May need prednisone taper and antibiotic.  No follow-ups on file.    I discussed the assessment and treatment plan with the patient. The patient was provided an opportunity to ask questions and all were answered. The patient agreed with the plan and demonstrated an understanding of the instructions.   The patient was advised to call back or seek an in-person evaluation if the symptoms worsen or if the condition fails to improve as anticipated.  I provided 10 minutes of non-face-to-face time during this encounter.  Andres Shad, PA, have reviewed all documentation for this visit. The documentation on 06/25/20 for the exam, diagnosis, procedures, and orders are all accurate and complete.   Vernie Murders, [place] [phone] (phone) [phone] (fax)  [place]

## 2020-06-17 ENCOUNTER — Ambulatory Visit: Payer: Self-pay | Admitting: Family Medicine

## 2020-06-21 ENCOUNTER — Other Ambulatory Visit: Payer: Self-pay | Admitting: Family Medicine

## 2020-06-21 MED ORDER — PROMETHAZINE-DM 6.25-15 MG/5ML PO SYRP: 5.0000 mL | ORAL_SOLUTION | Freq: Four times a day (QID) | ORAL | 0 refills | Status: DC | PRN

## 2020-06-25 ENCOUNTER — Encounter: Payer: Self-pay | Admitting: Family Medicine

## 2020-06-25 ENCOUNTER — Ambulatory Visit (INDEPENDENT_AMBULATORY_CARE_PROVIDER_SITE_OTHER): Payer: Medicare Other | Admitting: Family Medicine

## 2020-06-25 ENCOUNTER — Other Ambulatory Visit: Payer: Self-pay

## 2020-06-25 DIAGNOSIS — J040 Acute laryngitis: Secondary | ICD-10-CM

## 2020-06-25 MED ORDER — PREDNISONE 10 MG (21) PO TBPK: ORAL_TABLET | ORAL | 0 refills | Status: DC

## 2020-06-25 MED ORDER — AMOXICILLIN 875 MG PO TABS: 875.0000 mg | ORAL_TABLET | Freq: Two times a day (BID) | ORAL | 0 refills | Status: DC

## 2020-06-25 MED ORDER — BENZONATATE 200 MG PO CAPS: 200.0000 mg | ORAL_CAPSULE | Freq: Two times a day (BID) | ORAL | 0 refills | Status: DC | PRN

## 2020-06-25 NOTE — Progress Notes (Signed)
Virtual telephone visit    Virtual Visit via Telephone Note   This visit type was conducted due to national recommendations for restrictions regarding the COVID-19 Pandemic (e.g. social distancing) in an effort to limit this patient's exposure and mitigate transmission in our community. Due to his co-morbid illnesses, this patient is at least at moderate risk for complications without adequate follow up. This format is felt to be most appropriate for this patient at this time. The patient did not have access to video technology or had technical difficulties with video requiring transitioning to audio format only (telephone). Physical exam was limited to content and character of the telephone converstion.    Patient location: home Provider location: office  I discussed the limitations of evaluation and management by telemedicine and the availability of in person appointments. The patient expressed understanding and agreed to proceed.   Visit Date: 06/25/2020  Today's healthcare provider: Vernie Murders, PA   No chief complaint on file.  Subjective    HPI  Patient is a 69 year old male who presents via phone visit for follow up of his laryngitis. The patient was initially seen on 06/14/20.  He states he has been using the gargles but just at night.  He also reports that he wakes during the night coughing and wanted to get some prescription cough medicine for it.   Patient states he has used Prednisone when his voice gets like this, he is interested in trying a taper of steroids.  He denies any other associated symptoms and admits that he has not been able to refrain from talking and that is probably contributing to the continued laryngitis.    Past Medical History:  Diagnosis Date  . 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 lat. neck. Hypertrophic.  Marland Kitchen Hypertension    Past Surgical History:  Procedure Laterality Date  . CORNEAL TRANSPLANT    . TOE SURGERY     Ingrown nail x2    Social History   Tobacco Use  . Smoking status: Never Smoker  . Smokeless tobacco: Never Used  Vaping Use  . Vaping Use: Never used  Substance Use Topics  . Alcohol use: Yes    Alcohol/week: 3.0 standard drinks    Types: 3 Cans of beer per week  . Drug use: No   Family History  Problem Relation Age of Onset  . Stroke Mother   . Arthritis Mother   . Heart disease Father   . Cancer Father        lung  . Blindness Father 20   No Known Allergies    Medications: Outpatient Medications Prior to Visit  Medication Sig  . acyclovir (ZOVIRAX) 400 MG tablet Take 400 mg by mouth 4 (four) times daily.   Marland Kitchen aspirin (ASPIRIN ADULT LOW STRENGTH) 81 MG chewable tablet Chew 81 mg by mouth daily.   . folic acid (FOLVITE) 1 MG tablet Take 1 mg by mouth daily.   . Ganciclovir (ZIRGAN) 0.15 % GEL Apply 1 drop to eye daily.   Marland Kitchen HUMIRA PEN 40 MG/0.4ML PNKT Inject 1 pen/syringe subcutaneously every other week  . levothyroxine (SYNTHROID) 100 MCG tablet TAKE 1 TABLET BY MOUTH EVERY DAY  . methotrexate 2.5 MG tablet Take 10 mg by mouth. Takes 4 tablets once a week  . metoprolol succinate (TOPROL XL) 25 MG 24 hr tablet Take 25 mg by mouth 2 (two) times daily.   . prednisoLONE acetate (PRED FORTE) 1 % ophthalmic suspension Place 1 drop  into the left eye daily.   . promethazine-dextromethorphan (PROMETHAZINE-DM) 6.25-15 MG/5ML syrup Take 5 mLs by mouth 4 (four) times daily as needed. Gargle then swallow.  . RESTASIS 0.05 % ophthalmic emulsion Place 1 drop into the right eye 3 (three) times daily.   . fluorometholone (FML) 0.1 % ophthalmic suspension SHAKE LQ AND INT 1 GTT IN OS QID  . mometasone (ELOCON) 0.1 % cream APP 1 APPLINCATION ON THE AFFECTED NECK SKIN AREA QD UP TO 5 DAYS PER WK UNTIL CLEAR UTD  . OPHTHALMIC IRRIGATION SOLUTION OP Apply to eye in the morning, at noon, in the evening, and at bedtime. Plasma rich growth factor ophthalmic solution from [facility]  . predniSONE (STERAPRED UNI-PAK 21  TAB) 10 MG (21) TBPK tablet Taper as directed on the package (6,5,4,3,2,1) (Patient not taking: Reported on 11/27/2019)   No facility-administered medications prior to visit.    Review of Systems  Constitutional: Negative for fatigue and fever.  HENT: Positive for voice change. Negative for congestion, sinus pressure and sore throat.   Respiratory: Positive for cough. Negative for shortness of breath.       Objective    There were no vitals taken for this visit.     Assessment & Plan     1. Laryngitis Weak breathy voice with some cough at night persisting. No fever or malaise. No loss of taste or smell. No wheeze or shortness of breath. Still using the Promethazine-DM that helps him get to sleep but he will wake during the night with some cough. Won't use the Promethazine-DM during the day due to sleepiness. Treat with Amoxil, Prednisone taper and Tessalon Perles since this has persisted the past 6-10 days. Rest voice a much as possible and drink plenty of fluids. May need referral to ENT if no better in the next 5-7 days. - benzonatate (TESSALON) 200 MG capsule; Take 1 capsule (200 mg total) by mouth 2 (two) times daily as needed for cough.  Dispense: 20 capsule; Refill: 0 - amoxicillin (AMOXIL) 875 MG tablet; Take 1 tablet (875 mg total) by mouth 2 (two) times daily.  Dispense: 20 tablet; Refill: 0 - predniSONE (STERAPRED UNI-PAK 21 TAB) 10 MG (21) TBPK tablet; Taper as directed on the package (6,5,4,3,2,1)  Dispense: 21 tablet; Refill: 0   No follow-ups on file.    I discussed the assessment and treatment plan with the patient. The patient was provided an opportunity to ask questions and all were answered. The patient agreed with the plan and demonstrated an understanding of the instructions.   The patient was advised to call back or seek an in-person evaluation if the symptoms worsen or if the condition fails to improve as anticipated.  I provided 20 minutes of non-face-to-face  time during this encounter.  Andres Shad, PA, have reviewed all documentation for this visit. The documentation on 06/25/20 for the exam, diagnosis, procedures, and orders are all accurate and complete.   Vernie Murders, [place] [phone] (phone) [phone] (fax)  [place]

## 2020-07-04 ENCOUNTER — Other Ambulatory Visit: Payer: Self-pay

## 2020-07-04 ENCOUNTER — Encounter: Payer: Self-pay | Admitting: Family Medicine

## 2020-07-04 ENCOUNTER — Ambulatory Visit (INDEPENDENT_AMBULATORY_CARE_PROVIDER_SITE_OTHER): Payer: Medicare Other | Admitting: Family Medicine

## 2020-07-04 VITALS — BP 106/68 | HR 72 | Temp 98.2°F | Resp 16 | Ht 70.0 in | Wt 220.0 lb

## 2020-07-04 DIAGNOSIS — M069 Rheumatoid arthritis, unspecified: Secondary | ICD-10-CM

## 2020-07-04 DIAGNOSIS — E039 Hypothyroidism, unspecified: Secondary | ICD-10-CM | POA: Diagnosis not present

## 2020-07-04 DIAGNOSIS — R7989 Other specified abnormal findings of blood chemistry: Secondary | ICD-10-CM

## 2020-07-04 DIAGNOSIS — D539 Nutritional anemia, unspecified: Secondary | ICD-10-CM

## 2020-07-04 DIAGNOSIS — E785 Hyperlipidemia, unspecified: Secondary | ICD-10-CM | POA: Diagnosis not present

## 2020-07-04 NOTE — Progress Notes (Signed)
I,John Baxter,acting as a scribe for John Durie, MD.,have documented all relevant documentation on the behalf of John Durie, MD,as directed by  John Durie, MD while in the presence of John Durie, MD.   Established patient visit   Patient: John Baxter   DOB: 15-Sep-1950   69 y.o. Male  MRN: [id number] Visit Date: 07/04/2020  Today's healthcare provider: [name], MD   Chief Complaint  Patient presents with  . Follow-up   Subjective    HPI  Patient is doing fairly well.  Vision is improved with some contact lens provided him by ophthalmology. His lab work. Patient had labs checked 12/14/2019 showing-Labs in normal range with minimal elevation of liver function test. Work on diet and exercise. Lets repeat CBC and hepatic panel in 2 to 3 months.       Medications: Outpatient Medications Prior to Visit  Medication Sig  . acyclovir (ZOVIRAX) 400 MG tablet Take 400 mg by mouth 4 (four) times daily.   Marland Kitchen aspirin (ASPIRIN ADULT LOW STRENGTH) 81 MG chewable tablet Chew 81 mg by mouth daily.   . folic acid (FOLVITE) 1 MG tablet Take 1 mg by mouth daily.   . Ganciclovir (ZIRGAN) 0.15 % GEL Apply 1 drop to eye daily.   Marland Kitchen HUMIRA PEN 40 MG/0.4ML PNKT Inject 1 pen/syringe subcutaneously every other week  . levothyroxine (SYNTHROID) 100 MCG tablet TAKE 1 TABLET BY MOUTH EVERY DAY  . methotrexate 2.5 MG tablet Take 10 mg by mouth. Takes 4 tablets once a week  . metoprolol succinate (TOPROL XL) 25 MG 24 hr tablet Take 25 mg by mouth 2 (two) times daily.   . prednisoLONE acetate (PRED FORTE) 1 % ophthalmic suspension Place 1 drop into the left eye daily.   . RESTASIS 0.05 % ophthalmic emulsion Place 1 drop into the right eye 3 (three) times daily.   Marland Kitchen amoxicillin (AMOXIL) 875 MG tablet Take 1 tablet (875 mg total) by mouth 2 (two) times daily. (Patient not taking: Reported on 07/04/2020)  . benzonatate (TESSALON) 200 MG capsule Take 1 capsule (200  mg total) by mouth 2 (two) times daily as needed for cough. (Patient not taking: Reported on 07/04/2020)  . predniSONE (STERAPRED UNI-PAK 21 TAB) 10 MG (21) TBPK tablet Taper as directed on the package (6,5,4,3,2,1) (Patient not taking: Reported on 07/04/2020)   No facility-administered medications prior to visit.    Review of Systems  Constitutional: Negative for appetite change, chills and fever.  Respiratory: Negative for chest tightness, shortness of breath and wheezing.   Cardiovascular: Negative for chest pain and palpitations.  Gastrointestinal: Negative for abdominal pain, nausea and vomiting.       Objective    BP 106/68 (BP Location: Right Arm, Patient Position: Sitting, Cuff Size: Large)   Pulse 72   Temp 98.2 F (36.8 C) (Oral)   Resp 16   Ht 5\' 10"  (1.778 m)   Wt 220 lb (99.8 kg)   SpO2 97%   BMI 31.57 kg/m  Wt Readings from Last 3 Encounters:  07/04/20 220 lb (99.8 kg)  12/14/19 216 lb 9.6 oz (98.2 kg)  05/11/19 216 lb (98 kg)      Physical Exam Vitals reviewed.  Constitutional:      Appearance: He is well-developed.  HENT:     Head: Normocephalic and atraumatic.     Right Ear: External ear normal.     Left Ear: External ear normal.  Nose: Nose normal.  Eyes:     General: No scleral icterus.    Conjunctiva/sclera: Conjunctivae normal.  Neck:     Thyroid: No thyromegaly.  Cardiovascular:     Rate and Rhythm: Normal rate and regular rhythm.     Heart sounds: Normal heart sounds.  Pulmonary:     Effort: Pulmonary effort is normal.     Breath sounds: Normal breath sounds.  Abdominal:     Palpations: Abdomen is soft.  Skin:    General: Skin is warm and dry.  Neurological:     General: No focal deficit present.     Mental Status: He is alert and oriented to person, place, and time.  Psychiatric:        Mood and Affect: Mood normal.        Behavior: Behavior normal.        Thought Content: Thought content normal.        Judgment: Judgment  normal.       No results found for any visits on 07/04/20.  Assessment & Plan     1. Adult hypothyroidism  - CBC w/Diff/Platelet - TSH - Vitamin B12 - Comprehensive Metabolic Panel (CMET) - Lipid panel  2. Rheumatoid arthritis involving both hands, unspecified whether rheumatoid factor present (HCC) Also rheumatoid arthritis involving the eye. I - CBC w/Diff/Platelet - TSH - Vitamin B12 - Comprehensive Metabolic Panel (CMET) - Lipid panel  3. Hyperlipidemia, unspecified hyperlipidemia type  - CBC w/Diff/Platelet - TSH - Vitamin B12 - Comprehensive Metabolic Panel (CMET) - Lipid panel  4. Elevated liver function tests  - CBC w/Diff/Platelet - TSH - Vitamin B12 - Comprehensive Metabolic Panel (CMET) - Lipid panel  5. Macrocytic anemia B12 level.   No follow-ups on file.         Nainika Newlun Cranford Mon, MD  [facility] [phone] (phone) [phone] (fax)  [place]

## 2020-07-05 DIAGNOSIS — E039 Hypothyroidism, unspecified: Secondary | ICD-10-CM | POA: Diagnosis not present

## 2020-07-05 DIAGNOSIS — E785 Hyperlipidemia, unspecified: Secondary | ICD-10-CM | POA: Diagnosis not present

## 2020-07-05 DIAGNOSIS — M069 Rheumatoid arthritis, unspecified: Secondary | ICD-10-CM | POA: Diagnosis not present

## 2020-07-05 DIAGNOSIS — R7989 Other specified abnormal findings of blood chemistry: Secondary | ICD-10-CM | POA: Diagnosis not present

## 2020-07-05 DIAGNOSIS — G4733 Obstructive sleep apnea (adult) (pediatric): Secondary | ICD-10-CM | POA: Diagnosis not present

## 2020-07-05 DIAGNOSIS — D539 Nutritional anemia, unspecified: Secondary | ICD-10-CM | POA: Diagnosis not present

## 2020-07-06 LAB — CBC WITH DIFFERENTIAL/PLATELET
Basophils Absolute: 0 10*3/uL (ref 0.0–0.2)
Basos: 1 %
EOS (ABSOLUTE): 0.2 10*3/uL (ref 0.0–0.4)
Eos: 5 %
Hematocrit: 42.6 % (ref 37.5–51.0)
Hemoglobin: 15.1 g/dL (ref 13.0–17.7)
Immature Grans (Abs): 0 10*3/uL (ref 0.0–0.1)
Immature Granulocytes: 0 %
Lymphocytes Absolute: 1.5 10*3/uL (ref 0.7–3.1)
Lymphs: 29 %
MCH: 35.3 pg — ABNORMAL HIGH (ref 26.6–33.0)
MCHC: 35.4 g/dL (ref 31.5–35.7)
MCV: 100 fL — ABNORMAL HIGH (ref 79–97)
Monocytes Absolute: 1 10*3/uL — ABNORMAL HIGH (ref 0.1–0.9)
Monocytes: 20 %
Neutrophils Absolute: 2.3 10*3/uL (ref 1.4–7.0)
Neutrophils: 45 %
Platelets: 206 10*3/uL (ref 150–450)
RBC: 4.28 x10E6/uL (ref 4.14–5.80)
RDW: 13.3 % (ref 11.6–15.4)
WBC: 5.1 10*3/uL (ref 3.4–10.8)

## 2020-07-06 LAB — COMPREHENSIVE METABOLIC PANEL
ALT: 38 IU/L (ref 0–44)
AST: 31 IU/L (ref 0–40)
Albumin/Globulin Ratio: 1.4 (ref 1.2–2.2)
Albumin: 3.8 g/dL (ref 3.8–4.8)
Alkaline Phosphatase: 66 IU/L (ref 44–121)
BUN/Creatinine Ratio: 16 (ref 10–24)
BUN: 16 mg/dL (ref 8–27)
Bilirubin Total: 0.8 mg/dL (ref 0.0–1.2)
CO2: 24 mmol/L (ref 20–29)
Calcium: 8.8 mg/dL (ref 8.6–10.2)
Chloride: 105 mmol/L (ref 96–106)
Creatinine, Ser: 1.01 mg/dL (ref 0.76–1.27)
GFR calc Af Amer: 87 mL/min/{1.73_m2} (ref >59)
GFR calc non Af Amer: 76 mL/min/{1.73_m2} (ref >59)
Globulin, Total: 2.8 g/dL (ref 1.5–4.5)
Glucose: 97 mg/dL (ref 65–99)
Potassium: 4.4 mmol/L (ref 3.5–5.2)
Sodium: 140 mmol/L (ref 134–144)
Total Protein: 6.6 g/dL (ref 6.0–8.5)

## 2020-07-06 LAB — LIPID PANEL
Chol/HDL Ratio: 3.4 ratio (ref 0.0–5.0)
Cholesterol, Total: 172 mg/dL (ref 100–199)
HDL: 51 mg/dL (ref >39)
LDL Chol Calc (NIH): 105 mg/dL — ABNORMAL HIGH (ref 0–99)
Triglycerides: 85 mg/dL (ref 0–149)
VLDL Cholesterol Cal: 16 mg/dL (ref 5–40)

## 2020-07-06 LAB — VITAMIN B12: Vitamin B-12: 480 pg/mL (ref 232–1245)

## 2020-07-06 LAB — TSH: TSH: 3.21 u[IU]/mL (ref 0.450–4.500)

## 2020-07-18 ENCOUNTER — Telehealth: Payer: Self-pay | Admitting: *Deleted

## 2020-07-18 NOTE — Telephone Encounter (Signed)
No

## 2020-07-18 NOTE — Telephone Encounter (Signed)
Patient wanted to know if it is necessary for him to take a daily aspirin?

## 2020-07-19 NOTE — Telephone Encounter (Signed)
LMOVM for pt to return call 

## 2020-07-25 NOTE — Telephone Encounter (Signed)
Patient was notified of results. Expressed understanding.  

## 2020-07-26 DIAGNOSIS — Z79899 Other long term (current) drug therapy: Secondary | ICD-10-CM | POA: Diagnosis not present

## 2020-07-26 DIAGNOSIS — M0579 Rheumatoid arthritis with rheumatoid factor of multiple sites without organ or systems involvement: Secondary | ICD-10-CM | POA: Diagnosis not present

## 2020-08-04 DIAGNOSIS — G4733 Obstructive sleep apnea (adult) (pediatric): Secondary | ICD-10-CM | POA: Diagnosis not present

## 2020-08-28 DIAGNOSIS — H18892 Other specified disorders of cornea, left eye: Secondary | ICD-10-CM | POA: Diagnosis not present

## 2020-08-28 DIAGNOSIS — H2511 Age-related nuclear cataract, right eye: Secondary | ICD-10-CM | POA: Diagnosis not present

## 2020-08-28 DIAGNOSIS — H16002 Unspecified corneal ulcer, left eye: Secondary | ICD-10-CM | POA: Diagnosis not present

## 2020-08-30 ENCOUNTER — Other Ambulatory Visit: Payer: Self-pay | Admitting: Family Medicine

## 2020-08-31 IMAGING — CR DG CHEST 2V
1 series · 2 of 2 positions shown · non-contrast
Comparison: 05/31/2014 and on 08/20/2011

CLINICAL DATA: 67-year-old with fever, unspecified fever cause.
Night sweats.

EXAM:
CHEST - 2 VIEW

[Series 1: dg chest 2 view · 0.14mm/px · 2 of 2 slices shown]
[im 1/2]
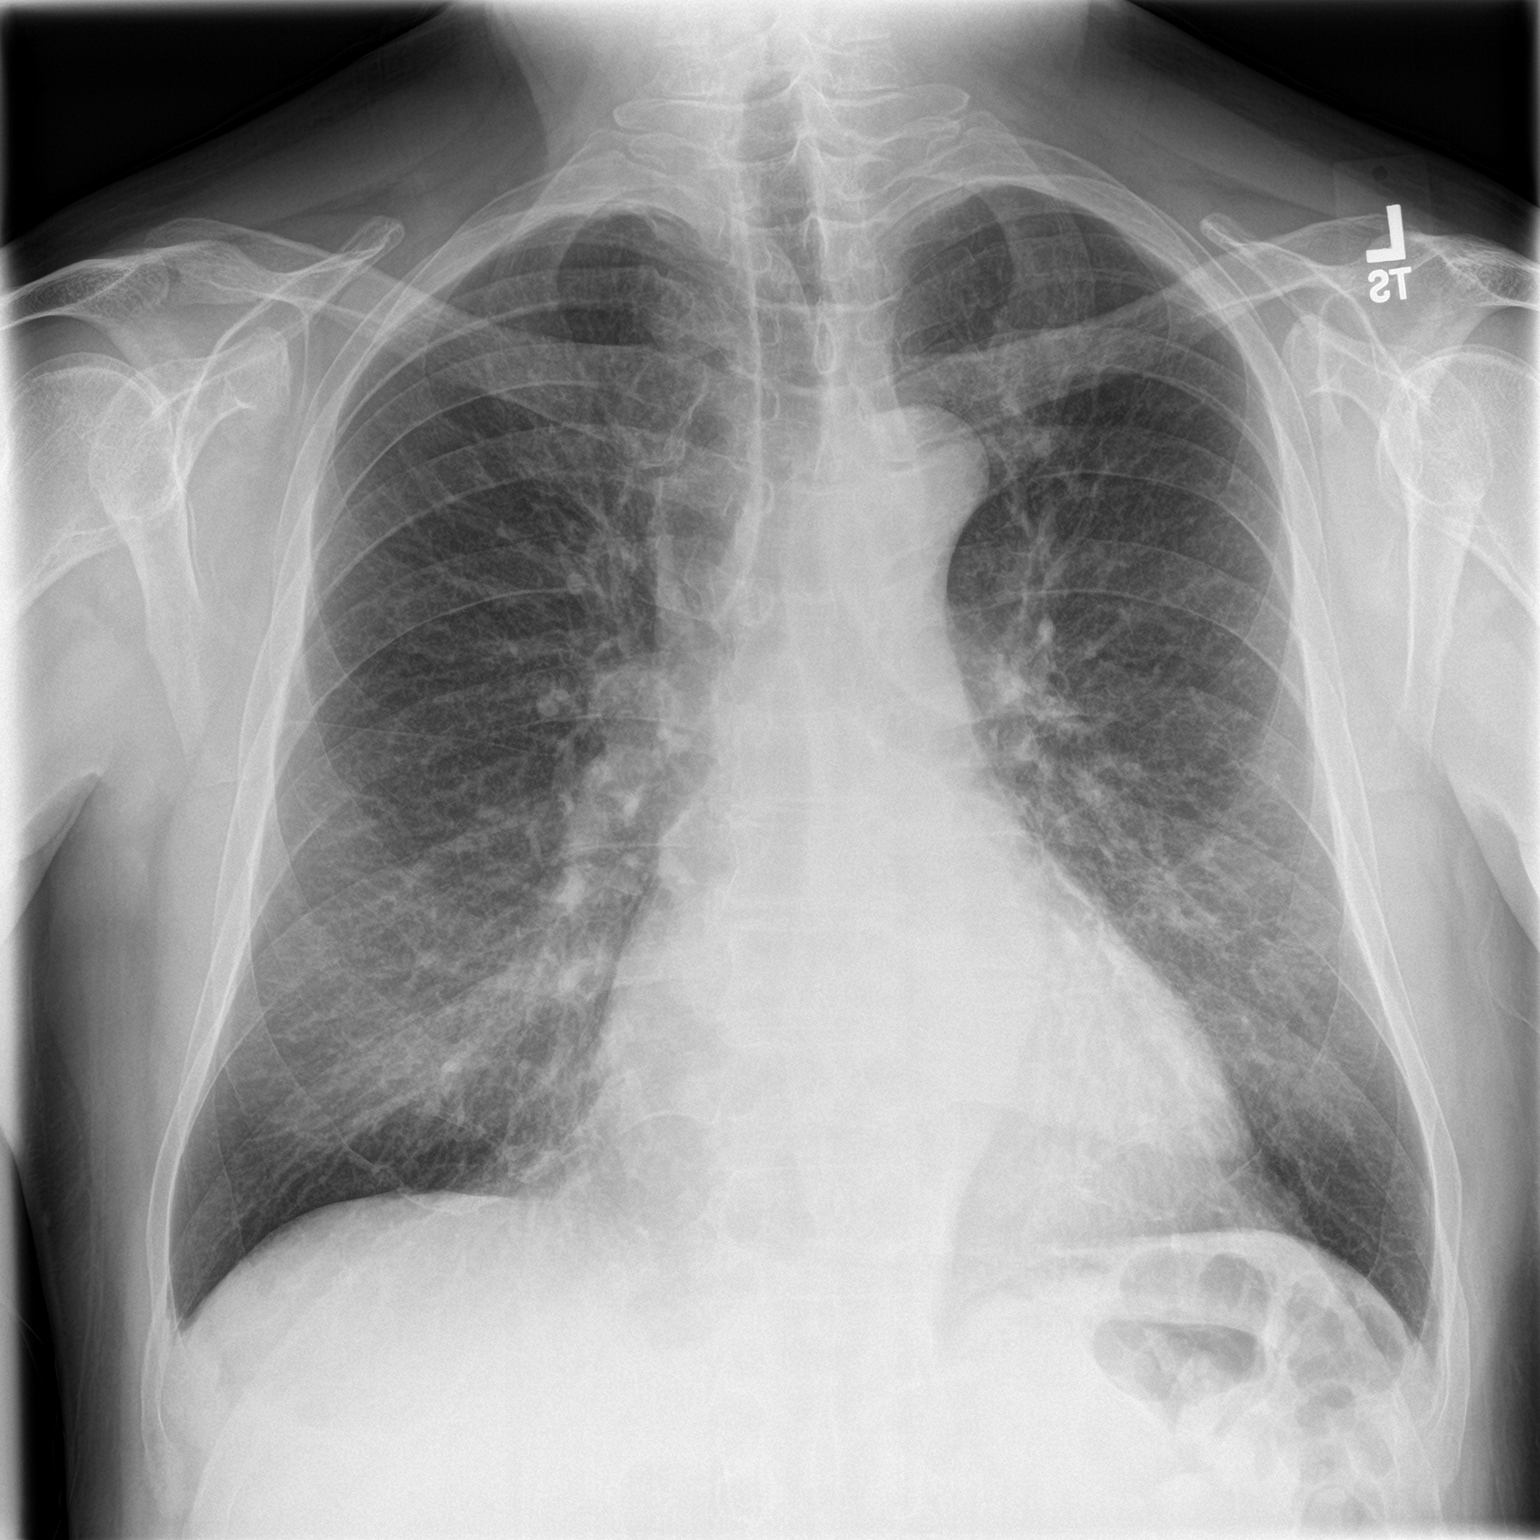
[im 2/2]
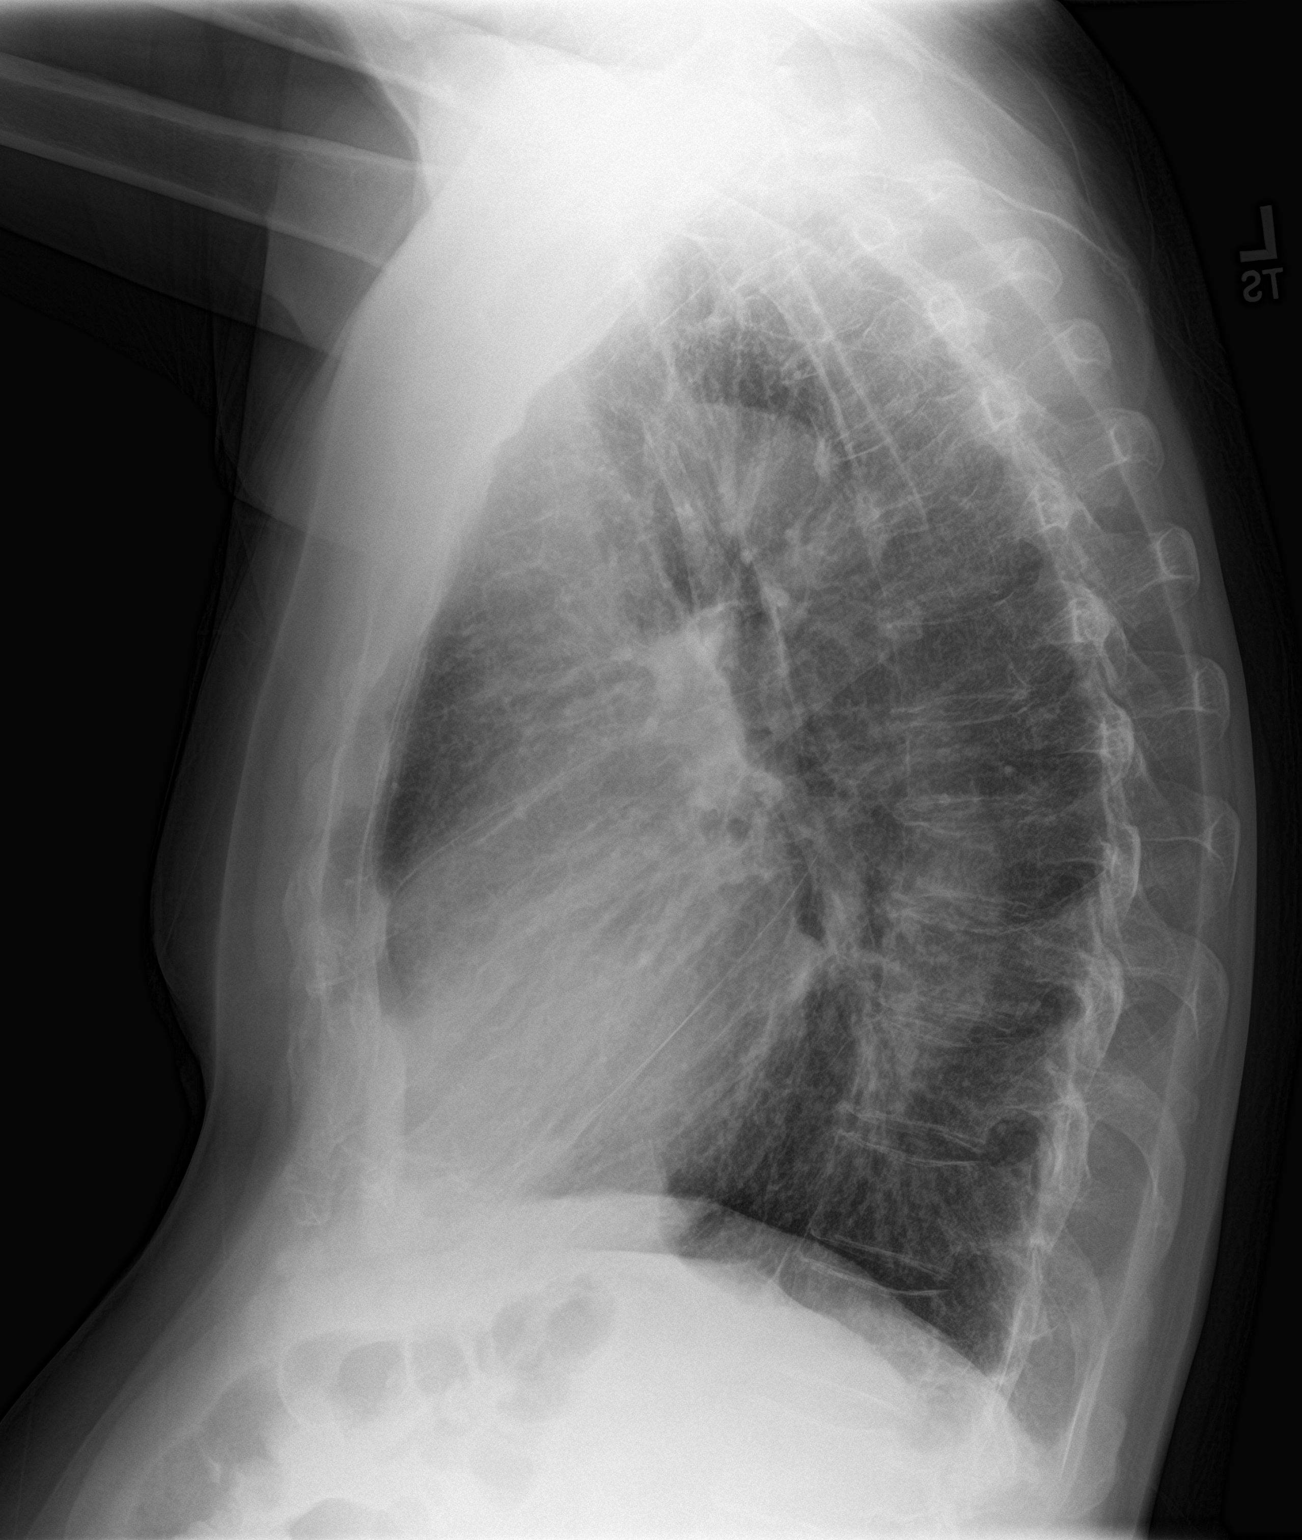

[2 of 2 positions shown; findings below may reference images not displayed]

FINDINGS: Coarse interstitial lung markings in the lower chest bilaterally,
right side greater than left. These interstitial markings appear
chronic but may be slightly increased in the right lower lung. Upper
lungs are clear. Heart and mediastinum are within normal limits.
Trachea is midline. No large pleural effusions. Bone structures are
unremarkable.
IMPRESSION: Prominent interstitial lung markings in the lower chest, right side
greater than left. Findings are likely related to chronic changes
but difficult to exclude an atypical infectious process.

## 2020-09-04 DIAGNOSIS — G4733 Obstructive sleep apnea (adult) (pediatric): Secondary | ICD-10-CM | POA: Diagnosis not present

## 2020-09-09 DIAGNOSIS — G4733 Obstructive sleep apnea (adult) (pediatric): Secondary | ICD-10-CM | POA: Diagnosis not present

## 2020-09-09 DIAGNOSIS — I482 Chronic atrial fibrillation, unspecified: Secondary | ICD-10-CM | POA: Diagnosis not present

## 2020-09-09 DIAGNOSIS — I428 Other cardiomyopathies: Secondary | ICD-10-CM | POA: Diagnosis not present

## 2020-09-09 DIAGNOSIS — I4821 Permanent atrial fibrillation: Secondary | ICD-10-CM | POA: Diagnosis not present

## 2020-09-17 DIAGNOSIS — M25552 Pain in left hip: Secondary | ICD-10-CM | POA: Diagnosis not present

## 2020-09-17 DIAGNOSIS — M0579 Rheumatoid arthritis with rheumatoid factor of multiple sites without organ or systems involvement: Secondary | ICD-10-CM | POA: Diagnosis not present

## 2020-09-19 ENCOUNTER — Ambulatory Visit: Payer: Medicare Other | Admitting: Dermatology

## 2020-10-05 DIAGNOSIS — G4733 Obstructive sleep apnea (adult) (pediatric): Secondary | ICD-10-CM | POA: Diagnosis not present

## 2020-10-22 ENCOUNTER — Telehealth: Payer: Self-pay

## 2020-10-22 DIAGNOSIS — E039 Hypothyroidism, unspecified: Secondary | ICD-10-CM

## 2020-10-22 MED ORDER — LEVOTHYROXINE SODIUM 100 MCG PO TABS: 100.0000 ug | ORAL_TABLET | Freq: Every day | ORAL | 3 refills | Status: DC

## 2020-10-22 NOTE — Telephone Encounter (Signed)
[facility] faxed refill request for the following medications:  levothyroxine (SYNTHROID) 100 MCG tablet   Please advise.  

## 2020-10-25 DIAGNOSIS — M0579 Rheumatoid arthritis with rheumatoid factor of multiple sites without organ or systems involvement: Secondary | ICD-10-CM | POA: Diagnosis not present

## 2020-10-25 DIAGNOSIS — Z79899 Other long term (current) drug therapy: Secondary | ICD-10-CM | POA: Diagnosis not present

## 2020-10-29 DIAGNOSIS — Z79899 Other long term (current) drug therapy: Secondary | ICD-10-CM | POA: Diagnosis not present

## 2020-10-29 DIAGNOSIS — I4891 Unspecified atrial fibrillation: Secondary | ICD-10-CM | POA: Diagnosis not present

## 2020-10-29 DIAGNOSIS — H16002 Unspecified corneal ulcer, left eye: Secondary | ICD-10-CM | POA: Diagnosis not present

## 2020-10-29 DIAGNOSIS — M0579 Rheumatoid arthritis with rheumatoid factor of multiple sites without organ or systems involvement: Secondary | ICD-10-CM | POA: Diagnosis not present

## 2020-10-29 DIAGNOSIS — M25552 Pain in left hip: Secondary | ICD-10-CM | POA: Diagnosis not present

## 2020-10-30 DIAGNOSIS — H16002 Unspecified corneal ulcer, left eye: Secondary | ICD-10-CM | POA: Diagnosis not present

## 2020-10-30 DIAGNOSIS — H2511 Age-related nuclear cataract, right eye: Secondary | ICD-10-CM | POA: Diagnosis not present

## 2020-10-30 DIAGNOSIS — Z947 Corneal transplant status: Secondary | ICD-10-CM | POA: Diagnosis not present

## 2020-10-30 DIAGNOSIS — H04129 Dry eye syndrome of unspecified lacrimal gland: Secondary | ICD-10-CM | POA: Diagnosis not present

## 2020-11-02 DIAGNOSIS — G4733 Obstructive sleep apnea (adult) (pediatric): Secondary | ICD-10-CM | POA: Diagnosis not present

## 2020-11-13 DIAGNOSIS — G4733 Obstructive sleep apnea (adult) (pediatric): Secondary | ICD-10-CM | POA: Diagnosis not present

## 2020-11-20 ENCOUNTER — Encounter: Payer: Self-pay | Admitting: Family Medicine

## 2020-11-20 ENCOUNTER — Telehealth (INDEPENDENT_AMBULATORY_CARE_PROVIDER_SITE_OTHER): Payer: Medicare Other | Admitting: Family Medicine

## 2020-11-20 ENCOUNTER — Ambulatory Visit: Payer: Medicare Other | Admitting: Dermatology

## 2020-11-20 DIAGNOSIS — J019 Acute sinusitis, unspecified: Secondary | ICD-10-CM

## 2020-11-20 DIAGNOSIS — J040 Acute laryngitis: Secondary | ICD-10-CM | POA: Diagnosis not present

## 2020-11-20 MED ORDER — BENZONATATE 200 MG PO CAPS: 200.0000 mg | ORAL_CAPSULE | Freq: Two times a day (BID) | ORAL | 0 refills | Status: DC | PRN

## 2020-11-20 MED ORDER — PREDNISONE 10 MG (21) PO TBPK: ORAL_TABLET | ORAL | 0 refills | Status: DC

## 2020-11-20 MED ORDER — HYDROCOD POLST-CPM POLST ER 10-8 MG/5ML PO SUER: 5.0000 mL | Freq: Every evening | ORAL | 0 refills | Status: AC | PRN

## 2020-11-20 NOTE — Progress Notes (Deleted)
Virtual Phone Visit   Patient: John Baxter   DOB: 1951-06-02   70 y.o. Male  MRN: [id number] Visit Date: 11/20/2020  Today's healthcare provider: Laurita Quint Mckynzie Liwanag, FNP   Chief Complaint  Patient presents with  . Laryngitis    Patient presents today with complaints of losing his voice, patient states that he noticed a week ago his voice change. Patient denies any URI like symptoms , fever, sore throat or difficulty swallowing.    Subjective    HPI HPI    Laryngitis     Additional comments: Patient presents today with complaints of losing his voice, patient states that he noticed a week ago his voice change. Patient denies any URI like symptoms , fever, sore throat or difficulty swallowing.        Last edited by Minette Headland, CMA on 11/20/2020 11:07 AM. 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 aspirin (ASPIRIN ADULT LOW STRENGTH) 81 MG chewable tablet Chew 81 mg by mouth daily.   . folic acid (FOLVITE) 1 MG tablet Take 1 mg by mouth daily.   Marland Kitchen HUMIRA PEN 40 MG/0.4ML PNKT Inject 1 pen/syringe subcutaneously every other week  . ibuprofen (ADVIL) 800 MG tablet   . levothyroxine (SYNTHROID) 100 MCG tablet Take 1 tablet (100 mcg total) by mouth daily.  . methotrexate 2.5 MG tablet Take 10 mg by mouth. Takes  4 tablets once a week  . metoprolol succinate (TOPROL XL) 25 MG 24 hr tablet Take 25 mg by mouth 2 (two) times daily.   . RESTASIS 0.05 % ophthalmic emulsion Place 1 drop into the right eye 3 (three) times daily.   . valACYclovir (VALTREX) 1000 MG tablet Take 1,000 mg by mouth 3 (three) times daily.  . [DISCONTINUED] acyclovir (ZOVIRAX) 400 MG tablet Take 400 mg by mouth 4 (four) times daily.   . [DISCONTINUED] amoxicillin (AMOXIL) 875 MG tablet Take 1 tablet (875 mg total) by mouth 2 (two) times daily. (Patient not taking: Reported on 07/04/2020)  . [DISCONTINUED] benzonatate (TESSALON) 200 MG capsule Take 1 capsule (200 mg total) by mouth 2 (two) times daily as needed for cough. (Patient not taking: Reported on 07/04/2020)  . [DISCONTINUED] Ganciclovir (ZIRGAN) 0.15 % GEL Apply 1 drop to eye daily.   . [DISCONTINUED] prednisoLONE acetate (PRED FORTE) 1 % ophthalmic suspension Place 1 drop into the left eye daily.   . [DISCONTINUED] predniSONE (STERAPRED UNI-PAK 21 TAB) 10 MG (21) TBPK tablet Taper as directed on the package (6,5,4,3,2,1) (Patient not taking: Reported on 07/04/2020)   No facility-administered medications prior to visit.    Review of Systems  Constitutional: Negative for chills, fatigue and fever.  HENT: Positive for voice change. Negative for congestion, drooling, postnasal drip, sinus pain, sneezing and sore throat.  Respiratory: Positive for cough. Negative for chest tightness, shortness of breath and wheezing.   Cardiovascular: Negative for chest pain.       Objective     Limited physical exam and no vital signs done given virtual visit   Physical Exam Constitutional:      General: He is not in acute distress. Pulmonary:     Effort: Pulmonary effort is normal.  Neurological:     Mental Status: He is alert and oriented to person, place, and time.  Psychiatric:        Mood and Affect: Mood normal.        Thought Content: Thought content normal.      No  results found for any visits on 11/20/20.  Assessment & Plan     Problem List Items Addressed This Visit   None   Visit Diagnoses    Laryngitis    -  Primary   Relevant Medications   benzonatate (TESSALON) 200 MG capsule   predniSONE (STERAPRED UNI-PAK 21 TAB) 10 MG (21) TBPK tablet   chlorpheniramine-HYDROcodone (TUSSIONEX PENNKINETIC ER) 10-8 MG/5ML SUER   Acute non-recurrent sinusitis, unspecified location       Relevant Medications   valACYclovir (VALTREX) 1000 MG tablet   benzonatate (TESSALON) 200 MG capsule   predniSONE (STERAPRED UNI-PAK 21 TAB) 10 MG (21) TBPK tablet   chlorpheniramine-HYDROcodone (TUSSIONEX PENNKINETIC ER) 10-8 MG/5ML SUER     Plan Prednisone taper ordered, r/se/b discussed Refills for cough syrup and tessalon sent Continue Conservative OTC treatments RTC/ED precautions discussed   Return in about 3 months (around 02/19/2021), or if symptoms worsen or fail to improve.       [place], [place] [phone] (phone) [phone] (fax)  [place]

## 2020-11-20 NOTE — Progress Notes (Signed)
Virtual Visit Note  I connected with patient on 11/20/20 at 1140 by telephone due to unable to work Epic video visit and verified that I am speaking with the correct person using two identifiers. John Baxter is currently located at home and no family members are currently with them during visit. The provider, Laurita Quint Esa Raden, FNP is located in their office at time of visit.  I discussed the limitations, risks, security and privacy concerns of performing an evaluation and management service by telephone and the availability of in person appointments. I also discussed with the patient that there may be a patient responsible charge related to this service. The patient expressed understanding and agreed to proceed.   I provided 20 minutes of non-face-to-face time during this encounter.  Chief Complaint  Patient presents with  . Laryngitis    Patient presents today with complaints of losing his voice, patient states that he noticed a week ago his voice change. Patient denies any URI like symptoms , fever, sore throat or difficulty swallowing.      HPI ?   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 Started in throat Now doesn't have a sore throat or congestion Coughs frequently at night Uses a CPAP at night This makes it difficult to wear the CPAP Will be traveling to [place] with Wife this weekend Wife is a Sports coach who will be presenting at a conference Has tried multiple cough syrups in the past  Finds Hydrocodone /chlorphen er susp works the best Last time he had this he was given antibiotics, steroids and cough syrup This helped resolve his symptoms Has not tried anything for his symptoms  No Known Allergies  Prior to Admission medications   Medication Sig Start Date End Date Taking? Authorizing Provider  benzonatate (TESSALON) 200 MG capsule Take 1 capsule (200 mg total) by mouth 2 (two) times daily  as needed for cough. 11/20/20  Yes Feliciano Wynter, Laurita Quint, FNP  chlorpheniramine-HYDROcodone (TUSSIONEX PENNKINETIC ER) 10-8 MG/5ML SUER Take 5 mLs by mouth at bedtime as needed for up to 14 days for cough. 11/20/20 12/04/20 Yes Linsey Hirota, Laurita Quint, FNP  predniSONE (STERAPRED UNI-PAK 21 TAB) 10 MG (21) TBPK tablet Taper as directed on the package 11/20/20  Yes Asucena Galer, Laurita Quint, FNP  aspirin (ASPIRIN ADULT LOW STRENGTH) 81 MG chewable tablet Chew 81 mg by mouth daily.  04/04/12   [provider]  folic acid (FOLVITE) 1 MG tablet Take 1 mg by mouth daily.  11/02/11   [provider]  HUMIRA PEN 40 MG/0.4ML PNKT Inject 1 pen/syringe subcutaneously every other week 10/21/18   [provider]  ibuprofen (ADVIL) 800 MG tablet  09/17/20   [provider]  levothyroxine (SYNTHROID) 100 MCG tablet Take 1 tablet (100 mcg total) by mouth daily. 10/22/20   Jerrol Banana., MD  methotrexate 2.5 MG tablet Take 10 mg by mouth. Takes 4 tablets once a week 09/25/10   [provider]  metoprolol succinate (TOPROL XL) 25 MG 24 hr tablet Take 25 mg by mouth 2 (two) times daily.  11/02/11   [provider]  RESTASIS 0.05 % ophthalmic emulsion Place 1 drop into the right eye 3 (three) times daily.  07/12/18   [provider]  valACYclovir (VALTREX) 1000 MG tablet Take 1,000 mg by mouth 3 (three) times daily. 09/16/20   [provider]    Past Medical History:  Diagnosis Date  . 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 lat. neck. Hypertrophic.  Marland Kitchen Hypertension     Past Surgical History:  Procedure Laterality Date  . CORNEAL TRANSPLANT    . TOE SURGERY     Ingrown nail x2    Social History   Tobacco Use  . Smoking status: Never Smoker  . Smokeless tobacco: Never Used  Substance Use Topics  . Alcohol use: Yes    Alcohol/week: 3.0 standard drinks    Types: 3 Cans of beer per week    Family History  Problem Relation Age of Onset  . Stroke Mother   .  Arthritis Mother   . Heart disease Father   . Cancer Father        lung  . Blindness Father 20    ROS   Constitutional: Negative for chills, fatigue and fever.  HENT: Positive for voice change. Negative for congestion, drooling, postnasal drip, sinus pain, sneezing and sore throat.   Respiratory: Positive for cough. Negative for chest tightness, shortness of breath and wheezing.   Cardiovascular: Negative for chest pain.   Objective  Constitutional:      General: Not in acute distress.    Appearance: Normal appearance. Not ill-appearing.   Pulmonary:     Effort: Pulmonary effort is normal. No respiratory distress.  Neurological:     Mental Status: Alert and oriented to person, place, and time.  Psychiatric:        Mood and Affect: Mood normal.        Behavior: Behavior normal.     ASSESSMENT and PLAN  Problem List Items Addressed This Visit   None   Visit Diagnoses    Laryngitis    -  Primary   Relevant Medications   benzonatate (TESSALON) 200 MG capsule   predniSONE (STERAPRED UNI-PAK 21 TAB) 10 MG (21) TBPK tablet   chlorpheniramine-HYDROcodone (TUSSIONEX PENNKINETIC ER) 10-8 MG/5ML SUER   Acute non-recurrent sinusitis, unspecified location       Relevant Medications   valACYclovir (VALTREX) 1000 MG tablet   benzonatate (TESSALON) 200 MG capsule   predniSONE (STERAPRED UNI-PAK 21 TAB) 10 MG (21) TBPK tablet   chlorpheniramine-HYDROcodone (TUSSIONEX PENNKINETIC ER) 10-8 MG/5ML SUER      Plan Prednisone taper ordered, r/se/b discussed Refills for cough syrup and tessalon sent Continue Conservative OTC treatments RTC/ED precautions discussed  Return in about 3 months (around 02/19/2021), or if symptoms worsen or fail to improve.    The above assessment and management plan was discussed with the patient. The patient verbalized understanding of and has agreed to the management plan. Patient is aware to call the clinic if symptoms persist or worsen. Patient is aware  when to return to the clinic for a follow-up visit. Patient educated on when it is appropriate to go to the emergency department.     Lyda Kalata, FNP-BC [employer] [phone] (phone) [phone] (fax)  [place]

## 2020-11-20 NOTE — Patient Instructions (Signed)
Laryngitis Laryngitis is irritation and swelling (inflammation) of your vocal cords. This condition causes symptoms such as:  A change in your voice. It may sound low and hoarse.  Loss of voice.  Coughing.  Sore throat.  Dry throat.  Stuffy nose. Depending on the cause, this condition may go away after a short time or may last for more than 3 weeks. Treatment often involves resting your voice and using medicines to soothe your throat. Follow these instructions at home: Medicines  Take over-the-counter and prescription medicines only as told by your doctor.  If you were prescribed an antibiotic medicine, take it as told by your doctor. Do not stop taking it even if you start to feel better. General instructions  Talk as little as possible. Also avoid whispering.  Write instead of talking. Do this until your voice is back to normal.  Drink enough fluid to keep your pee (urine) pale yellow.  Breathe in moist air. Use a humidifier if you live in a dry climate.  Do not use any products that have nicotine or tobacco in them, such as cigarettes and e-cigarettes. If you need help quitting, ask your doctor. Contact a doctor if:  You have a fever.  Your pain is worse.  Your symptoms do not get better in 2 weeks. Get help right away if:  You cough up blood.  You have trouble swallowing.  You have trouble breathing. Summary  Laryngitis is inflammation of your vocal cords.  This condition causes your voice to sound low and hoarse.  Rest your voice by talking as little as possible. Also avoid whispering. This information is not intended to replace advice given to you by your health care provider. Make sure you discuss any questions you have with your health care provider. Document Revised: 07/21/2017 Document Reviewed: 07/21/2017 Elsevier Patient Education  2021 [employer].

## 2020-12-16 ENCOUNTER — Encounter: Payer: Self-pay | Admitting: Family Medicine

## 2021-01-31 DIAGNOSIS — Z79899 Other long term (current) drug therapy: Secondary | ICD-10-CM | POA: Diagnosis not present

## 2021-01-31 DIAGNOSIS — M0579 Rheumatoid arthritis with rheumatoid factor of multiple sites without organ or systems involvement: Secondary | ICD-10-CM | POA: Diagnosis not present

## 2021-02-11 DIAGNOSIS — G4733 Obstructive sleep apnea (adult) (pediatric): Secondary | ICD-10-CM | POA: Diagnosis not present

## 2021-03-12 ENCOUNTER — Other Ambulatory Visit: Payer: Self-pay

## 2021-03-12 ENCOUNTER — Ambulatory Visit: Payer: Medicare Other | Admitting: Dermatology

## 2021-03-12 DIAGNOSIS — L821 Other seborrheic keratosis: Secondary | ICD-10-CM

## 2021-03-12 DIAGNOSIS — L570 Actinic keratosis: Secondary | ICD-10-CM | POA: Diagnosis not present

## 2021-03-12 DIAGNOSIS — L578 Other skin changes due to chronic exposure to nonionizing radiation: Secondary | ICD-10-CM

## 2021-03-12 NOTE — Patient Instructions (Signed)

## 2021-03-12 NOTE — Progress Notes (Signed)
   Follow-Up Visit   Subjective  John Baxter is a 70 y.o. male who presents for the following: Actinic Keratosis (Face, ears, hands, >46mf/u).  The following portions of the chart were reviewed this encounter and updated as appropriate:   Tobacco  Allergies  Meds  Problems  Med Hx  Surg Hx  Fam Hx     Review of Systems:  No other skin or systemic complaints except as noted in HPI or Assessment and Plan.  Objective  Well appearing patient in no apparent distress; mood and affect are within normal limits.  A focused examination was performed including face, ears, hands, arms, R leg. Relevant physical exam findings are noted in the Assessment and Plan.  face x 11 (11) Pink scaly macules   Assessment & Plan   Actinic Damage - chronic, secondary to cumulative UV radiation exposure/sun exposure over time - diffuse scaly erythematous macules with underlying dyspigmentation - Recommend daily broad spectrum sunscreen SPF 30+ to sun-exposed areas, reapply every 2 hours as needed.  - Recommend staying in the shade or wearing long sleeves, sun glasses (UVA+UVB protection) and wide brim hats (4-inch brim around the entire circumference of the hat). - Call for new or changing lesions.   AK (actinic keratosis) (11) face x 11  Destruction of lesion - face x 11 Complexity: simple   Destruction method: cryotherapy   Informed consent: discussed and consent obtained   Timeout:  patient name, date of birth, surgical site, and procedure verified Lesion destroyed using liquid nitrogen: Yes   Region frozen until ice ball extended beyond lesion: Yes   Outcome: patient tolerated procedure well with no complications   Post-procedure details: wound care instructions given    Seborrheic Keratoses - Stuck-on, waxy, tan-brown papules and/or plaques  - Benign-appearing - Discussed benign etiology and prognosis. - Observe - Call for any changes  Return in about 6 months (around  09/12/2021) for TBSE, Hx of AKs.  I, SOthelia Pulling RMA, am acting as scribe for DSarina Ser MD . Documentation: I have reviewed the above documentation for accuracy and completeness, and I agree with the above.  DSarina Ser MD

## 2021-03-13 ENCOUNTER — Encounter: Payer: Self-pay | Admitting: Dermatology

## 2021-04-09 ENCOUNTER — Encounter: Payer: Self-pay | Admitting: Family Medicine

## 2021-04-17 DIAGNOSIS — H16002 Unspecified corneal ulcer, left eye: Secondary | ICD-10-CM | POA: Diagnosis not present

## 2021-04-17 DIAGNOSIS — Z947 Corneal transplant status: Secondary | ICD-10-CM | POA: Diagnosis not present

## 2021-04-22 DIAGNOSIS — H04129 Dry eye syndrome of unspecified lacrimal gland: Secondary | ICD-10-CM | POA: Diagnosis not present

## 2021-04-22 DIAGNOSIS — Z947 Corneal transplant status: Secondary | ICD-10-CM | POA: Diagnosis not present

## 2021-05-06 DIAGNOSIS — Z79899 Other long term (current) drug therapy: Secondary | ICD-10-CM | POA: Diagnosis not present

## 2021-05-06 DIAGNOSIS — I429 Cardiomyopathy, unspecified: Secondary | ICD-10-CM | POA: Diagnosis not present

## 2021-05-06 DIAGNOSIS — H16002 Unspecified corneal ulcer, left eye: Secondary | ICD-10-CM | POA: Diagnosis not present

## 2021-05-06 DIAGNOSIS — I4891 Unspecified atrial fibrillation: Secondary | ICD-10-CM | POA: Diagnosis not present

## 2021-05-06 DIAGNOSIS — M0579 Rheumatoid arthritis with rheumatoid factor of multiple sites without organ or systems involvement: Secondary | ICD-10-CM | POA: Diagnosis not present

## 2021-05-11 ENCOUNTER — Ambulatory Visit: Payer: Self-pay

## 2021-05-11 ENCOUNTER — Ambulatory Visit: Payer: Medicare Other

## 2021-05-13 DIAGNOSIS — R55 Syncope and collapse: Secondary | ICD-10-CM | POA: Diagnosis not present

## 2021-05-13 DIAGNOSIS — I428 Other cardiomyopathies: Secondary | ICD-10-CM | POA: Diagnosis not present

## 2021-05-13 DIAGNOSIS — I482 Chronic atrial fibrillation, unspecified: Secondary | ICD-10-CM | POA: Diagnosis not present

## 2021-05-13 DIAGNOSIS — I4821 Permanent atrial fibrillation: Secondary | ICD-10-CM | POA: Diagnosis not present

## 2021-05-15 DIAGNOSIS — G4733 Obstructive sleep apnea (adult) (pediatric): Secondary | ICD-10-CM | POA: Diagnosis not present

## 2021-06-02 DIAGNOSIS — R55 Syncope and collapse: Secondary | ICD-10-CM | POA: Diagnosis not present

## 2021-06-03 DIAGNOSIS — I6521 Occlusion and stenosis of right carotid artery: Secondary | ICD-10-CM | POA: Diagnosis not present

## 2021-06-03 DIAGNOSIS — R55 Syncope and collapse: Secondary | ICD-10-CM | POA: Diagnosis not present

## 2021-06-04 ENCOUNTER — Other Ambulatory Visit: Payer: Self-pay

## 2021-06-04 ENCOUNTER — Ambulatory Visit (INDEPENDENT_AMBULATORY_CARE_PROVIDER_SITE_OTHER): Payer: Medicare Other | Admitting: Family Medicine

## 2021-06-04 VITALS — BP 116/80 | HR 97 | Temp 97.8°F | Ht 70.0 in | Wt 219.0 lb

## 2021-06-04 DIAGNOSIS — Z23 Encounter for immunization: Secondary | ICD-10-CM | POA: Diagnosis not present

## 2021-06-04 DIAGNOSIS — Z0000 Encounter for general adult medical examination without abnormal findings: Secondary | ICD-10-CM | POA: Diagnosis not present

## 2021-06-04 DIAGNOSIS — I4811 Longstanding persistent atrial fibrillation: Secondary | ICD-10-CM

## 2021-06-04 DIAGNOSIS — Z125 Encounter for screening for malignant neoplasm of prostate: Secondary | ICD-10-CM

## 2021-06-04 DIAGNOSIS — G4733 Obstructive sleep apnea (adult) (pediatric): Secondary | ICD-10-CM | POA: Diagnosis not present

## 2021-06-04 DIAGNOSIS — R7989 Other specified abnormal findings of blood chemistry: Secondary | ICD-10-CM | POA: Diagnosis not present

## 2021-06-04 DIAGNOSIS — R55 Syncope and collapse: Secondary | ICD-10-CM

## 2021-06-04 DIAGNOSIS — M069 Rheumatoid arthritis, unspecified: Secondary | ICD-10-CM

## 2021-06-04 DIAGNOSIS — Z1211 Encounter for screening for malignant neoplasm of colon: Secondary | ICD-10-CM | POA: Diagnosis not present

## 2021-06-04 DIAGNOSIS — E785 Hyperlipidemia, unspecified: Secondary | ICD-10-CM | POA: Diagnosis not present

## 2021-06-04 DIAGNOSIS — E039 Hypothyroidism, unspecified: Secondary | ICD-10-CM | POA: Diagnosis not present

## 2021-06-04 NOTE — Progress Notes (Signed)
Complete physical exam   Patient: John Baxter   DOB: 1950-12-14   70 y.o. Male  MRN: [id number] Visit Date: 06/04/2021  Today's healthcare provider: [name], MD   No chief complaint on file.  Subjective    John Baxter is a 70 y.o. male who presents today for a complete physical exam.  He reports consuming a general diet. The patient does not participate in regular exercise at present. He generally feels well. He reports sleeping well. He does not have additional problems to discuss today.  He is now a Company secretary.  He is married with no children. HPI  Patient did have a recent syncopal episode after eating at home.  It lasted about 30 seconds.  It sounds like it was a vasovagal syncopal event.  He has seen cardiology and has had a Holter monitor ETT carotids and an echocardiogram.  He is off of metoprolol presently Past Medical History:  Diagnosis Date   Actinic keratosis 01/02/2015   Right post. lat. neck. Hypertrophic.   Hypertension    Past Surgical History:  Procedure Laterality Date   CORNEAL TRANSPLANT     TOE SURGERY     Ingrown nail x2   Social History   Socioeconomic History   Marital status: Married    Spouse name: Not on file   Number of children: 0   Years of education: Not on file   Highest education level: Master's degree (e.g., MA, MS, MEng, MEd, MSW, MBA)  Occupational History    Comment: Pastoral care pastor  Tobacco Use   Smoking status: Never   Smokeless tobacco: Never  Vaping Use   Vaping Use: Never used  Substance and Sexual Activity   Alcohol use: Yes    Alcohol/week: 3.0 standard drinks    Types: 3 Cans of beer per week   Drug use: No   Sexual activity: Not on file  Other Topics Concern   Not on file  Social History Narrative   Not on file   Social Determinants of Health   Financial Resource Strain: Not on file  Food Insecurity: Not on file  Transportation Needs: Not on file  Physical Activity: Not on  file  Stress: Not on file  Social Connections: Not on file  Intimate Partner Violence: Not on file   Family Status  Relation Name Status   Mother  Deceased       died in 64. Cause of death was stroke   Father  Deceased at age 55       Cause of death was lung cancer and CHF   Sister  Alive   Sister  28   Family History  Problem Relation Age of Onset   Stroke Mother    Arthritis Mother    Heart disease Father    Cancer Father        lung   Blindness Father 23   No Known Allergies  Patient Care Team: Jerrol Banana., MD as PCP - General (Family Medicine) Ramonita Lab, Jovita Kussmaul, MD as Referring Physician (Ophthalmology) Emmaline Kluver., MD (Rheumatology) Corey Skains, MD as Consulting Physician (Cardiology) Ralene Bathe, MD (Dermatology)   Medications: Outpatient Medications Prior to Visit  Medication Sig   aspirin 81 MG chewable tablet Chew 81 mg by mouth daily.    benzonatate (TESSALON) 200 MG capsule Take 1 capsule (200 mg total) by mouth 2 (two) times daily as needed for cough.  folic acid (FOLVITE) 1 MG tablet Take 1 mg by mouth daily.    HUMIRA PEN 40 MG/0.4ML PNKT Inject 1 pen/syringe subcutaneously every other week   ibuprofen (ADVIL) 800 MG tablet    levothyroxine (SYNTHROID) 100 MCG tablet Take 1 tablet (100 mcg total) by mouth daily.   methotrexate 2.5 MG tablet Take 10 mg by mouth. Takes 4 tablets once a week   metoprolol succinate (TOPROL-XL) 25 MG 24 hr tablet Take 25 mg by mouth 2 (two) times daily.    predniSONE (STERAPRED UNI-PAK 21 TAB) 10 MG (21) TBPK tablet Taper as directed on the package   RESTASIS 0.05 % ophthalmic emulsion Place 1 drop into the right eye 3 (three) times daily.    valACYclovir (VALTREX) 1000 MG tablet Take 1,000 mg by mouth 3 (three) times daily.   No facility-administered medications prior to visit.    Review of Systems  Constitutional: Negative.   HENT: Negative.    Eyes: Negative.    Respiratory:  Positive for apnea.   Cardiovascular: Negative.   Gastrointestinal: Negative.   Endocrine: Negative.   Genitourinary: Negative.   Musculoskeletal: Negative.   Skin: Negative.   Allergic/Immunologic: Negative.   Neurological: Negative.   Hematological: Negative.   Psychiatric/Behavioral: Negative.        Objective    BP 116/80 (BP Location: Right Arm, Patient Position: Sitting, Cuff Size: Large)   Pulse 97   Temp 97.8 F (36.6 C) (Oral)   Ht 5\' 10"  (1.778 m)   Wt 219 lb (99.3 kg)   SpO2 98%   BMI 31.42 kg/m  BP Readings from Last 3 Encounters:  06/04/21 116/80  07/04/20 106/68  12/14/19 109/72   Wt Readings from Last 3 Encounters:  06/04/21 219 lb (99.3 kg)  07/04/20 220 lb (99.8 kg)  12/14/19 216 lb 9.6 oz (98.2 kg)      Physical Exam Constitutional:      Appearance: Normal appearance. He is normal weight.  HENT:     Head: Normocephalic and atraumatic.     Right Ear: Tympanic membrane, ear canal and external ear normal.     Left Ear: Tympanic membrane, ear canal and external ear normal.     Nose: Nose normal.     Mouth/Throat:     Mouth: Mucous membranes are moist.     Pharynx: Oropharynx is clear.  Eyes:     Extraocular Movements: Extraocular movements intact.     Conjunctiva/sclera: Conjunctivae normal.     Pupils: Pupils are equal, round, and reactive to light.  Cardiovascular:     Rate and Rhythm: Normal rate and regular rhythm.     Pulses: Normal pulses.     Heart sounds: Normal heart sounds.  Pulmonary:     Effort: Pulmonary effort is normal.     Breath sounds: Normal breath sounds.  Abdominal:     General: Abdomen is flat. Bowel sounds are normal.     Palpations: Abdomen is soft.  Musculoskeletal:     Cervical back: Normal range of motion and neck supple.  Skin:    General: Skin is warm and dry.     Comments: Very fair skin.  Neurological:     General: No focal deficit present.     Mental Status: He is alert and oriented to  person, place, and time. Mental status is at baseline.  Psychiatric:        Mood and Affect: Mood normal.        Behavior: Behavior normal.  Thought Content: Thought content normal.        Judgment: Judgment normal.      Last depression screening scores PHQ 2/9 Scores 06/04/2021 07/04/2020 11/27/2019  PHQ - 2 Score 0 0 0  PHQ- 9 Score 0 0 -   Last fall risk screening Fall Risk  06/04/2021  Falls in the past year? 1  Number falls in past yr: 0  Injury with Fall? 0  Follow up -   Last Audit-C alcohol use screening Alcohol Use Disorder Test (AUDIT) 06/04/2021  1. How often do you have a drink containing alcohol? 3  2. How many drinks containing alcohol do you have on a typical day when you are drinking? 0  3. How often do you have six or more drinks on one occasion? 0  AUDIT-C Score 3  4. How often during the last year have you found that you were not able to stop drinking once you had started? -  5. How often during the last year have you failed to do what was normally expected from you because of drinking? -  6. How often during the last year have you needed a first drink in the morning to get yourself going after a heavy drinking session? -  7. How often during the last year have you had a feeling of guilt of remorse after drinking? -  8. How often during the last year have you been unable to remember what happened the night before because you had been drinking? -  9. Have you or someone else been injured as a result of your drinking? -  10. Has a relative or friend or a doctor or another health worker been concerned about your drinking or suggested you cut down? -  Alcohol Use Disorder Identification Test Final Score (AUDIT) -  Alcohol Brief Interventions/Follow-up -   A score of 3 or more in women, and 4 or more in men indicates increased risk for alcohol abuse, EXCEPT if all of the points are from question 1   No results found for any visits on 06/04/21.  Assessment &  Plan    Routine Health Maintenance and Physical Exam  Exercise Activities and Dietary recommendations  Goals      DIET - INCREASE WATER INTAKE     Recommend to drink at least 6-8 8oz glasses of water per day.        Immunization History  Administered Date(s) Administered   Fluad Quad(high Dose 65+) 05/11/2019, 06/06/2020   Influenza, High Dose Seasonal PF 06/24/2016, 07/16/2017, 06/02/2018   Influenza,inj,Quad PF,6+ Mos 08/14/2015   PFIZER(Purple Top)SARS-COV-2 Vaccination 09/30/2019, 10/21/2019   Pneumococcal Conjugate-13 08/05/2016   Td 05/11/2019   Tdap 09/11/2008    Health Maintenance  Topic Date Due   Hepatitis C Screening  Never done   Zoster Vaccines- Shingrix (1 of 2) Never done   Pneumonia Vaccine 1+ Years old (2 - PPSV23 or PCV20) 08/05/2017   COVID-19 Vaccine (3 - Pfizer risk series) 11/18/2019   COLONOSCOPY (Pts 45-72yrs Insurance coverage will need to be confirmed)  10/22/2020   INFLUENZA VACCINE  03/17/2021   TETANUS/TDAP  05/10/2029   HPV VACCINES  Aged Out    Discussed health benefits of physical activity, and encouraged him to engage in regular exercise appropriate for his age and condition.  1. Annual physical exam   2. Colon cancer screening  - Ambulatory referral to Gastroenterology  3. Need for influenza vaccination  - Flu Vaccine QUAD High Dose(Fluad)  4.  Adult hypothyroidism  - TSH  5. Hyperlipidemia, unspecified hyperlipidemia type  - Comprehensive metabolic panel - Lipid Panel With LDL/HDL Ratio  6. Elevated liver function tests  - CBC with Differential/Platelet - Comprehensive metabolic panel  7. Prostate cancer screening  - PSA  8. Vasovagal syncope Work-up underway by cardiology.  No neurologic symptoms at this time.  9. Longstanding persistent atrial fibrillation ([place])   10. Rheumatoid arthritis involving both hands, unspecified whether rheumatoid factor present ([place]) Also has had rheumatoid changes and is  followed by [facility] ophthalmology  11. Obstructive sleep apnea syndrome    No follow-ups on file.     I, [name], MD, have reviewed all documentation for this visit. The documentation on 06/10/21 for the exam, diagnosis, procedures, and orders are all accurate and complete.    Kyannah Climer Cranford Mon, MD  [facility] [phone] (phone) [phone] (fax)  [place]

## 2021-06-05 LAB — LIPID PANEL WITH LDL/HDL RATIO
Cholesterol, Total: 172 mg/dL (ref 100–199)
HDL: 50 mg/dL (ref >39)
LDL Chol Calc (NIH): 107 mg/dL — ABNORMAL HIGH (ref 0–99)
LDL/HDL Ratio: 2.1 ratio (ref 0.0–3.6)
Triglycerides: 81 mg/dL (ref 0–149)
VLDL Cholesterol Cal: 15 mg/dL (ref 5–40)

## 2021-06-05 LAB — CBC WITH DIFFERENTIAL/PLATELET
Basophils Absolute: 0.1 10*3/uL (ref 0.0–0.2)
Basos: 1 %
EOS (ABSOLUTE): 0.1 10*3/uL (ref 0.0–0.4)
Eos: 3 %
Hematocrit: 41 % (ref 37.5–51.0)
Hemoglobin: 15 g/dL (ref 13.0–17.7)
Immature Grans (Abs): 0 10*3/uL (ref 0.0–0.1)
Immature Granulocytes: 0 %
Lymphocytes Absolute: 1.5 10*3/uL (ref 0.7–3.1)
Lymphs: 28 %
MCH: 35.2 pg — ABNORMAL HIGH (ref 26.6–33.0)
MCHC: 36.6 g/dL — ABNORMAL HIGH (ref 31.5–35.7)
MCV: 96 fL (ref 79–97)
Monocytes Absolute: 0.6 10*3/uL (ref 0.1–0.9)
Monocytes: 10 %
Neutrophils Absolute: 3.2 10*3/uL (ref 1.4–7.0)
Neutrophils: 58 %
Platelets: 188 10*3/uL (ref 150–450)
RBC: 4.26 x10E6/uL (ref 4.14–5.80)
RDW: 12 % (ref 11.6–15.4)
WBC: 5.5 10*3/uL (ref 3.4–10.8)

## 2021-06-05 LAB — COMPREHENSIVE METABOLIC PANEL
ALT: 26 IU/L (ref 0–44)
AST: 29 IU/L (ref 0–40)
Albumin/Globulin Ratio: 1.3 (ref 1.2–2.2)
Albumin: 3.9 g/dL (ref 3.8–4.8)
Alkaline Phosphatase: 106 IU/L (ref 44–121)
BUN/Creatinine Ratio: 17 (ref 10–24)
BUN: 17 mg/dL (ref 8–27)
Bilirubin Total: 0.9 mg/dL (ref 0.0–1.2)
CO2: 23 mmol/L (ref 20–29)
Calcium: 9.4 mg/dL (ref 8.6–10.2)
Chloride: 105 mmol/L (ref 96–106)
Creatinine, Ser: 1.03 mg/dL (ref 0.76–1.27)
Globulin, Total: 3.1 g/dL (ref 1.5–4.5)
Glucose: 87 mg/dL (ref 70–99)
Potassium: 4.7 mmol/L (ref 3.5–5.2)
Sodium: 139 mmol/L (ref 134–144)
Total Protein: 7 g/dL (ref 6.0–8.5)
eGFR: 78 mL/min/{1.73_m2} (ref >59)

## 2021-06-05 LAB — TSH: TSH: 2.6 u[IU]/mL (ref 0.450–4.500)

## 2021-06-05 LAB — PSA: Prostate Specific Ag, Serum: 2.8 ng/mL (ref 0.0–4.0)

## 2021-06-13 DIAGNOSIS — G4733 Obstructive sleep apnea (adult) (pediatric): Secondary | ICD-10-CM | POA: Diagnosis not present

## 2021-06-13 DIAGNOSIS — E782 Mixed hyperlipidemia: Secondary | ICD-10-CM | POA: Diagnosis not present

## 2021-06-13 DIAGNOSIS — I071 Rheumatic tricuspid insufficiency: Secondary | ICD-10-CM | POA: Diagnosis not present

## 2021-06-13 DIAGNOSIS — I428 Other cardiomyopathies: Secondary | ICD-10-CM | POA: Diagnosis not present

## 2021-06-13 DIAGNOSIS — I482 Chronic atrial fibrillation, unspecified: Secondary | ICD-10-CM | POA: Diagnosis not present

## 2021-06-13 DIAGNOSIS — I340 Nonrheumatic mitral (valve) insufficiency: Secondary | ICD-10-CM | POA: Diagnosis not present

## 2021-06-25 ENCOUNTER — Ambulatory Visit: Payer: Medicare Other

## 2021-07-01 DIAGNOSIS — Z9989 Dependence on other enabling machines and devices: Secondary | ICD-10-CM | POA: Diagnosis not present

## 2021-07-01 DIAGNOSIS — Z86010 Personal history of colonic polyps: Secondary | ICD-10-CM | POA: Diagnosis not present

## 2021-07-01 DIAGNOSIS — G4733 Obstructive sleep apnea (adult) (pediatric): Secondary | ICD-10-CM | POA: Diagnosis not present

## 2021-07-01 DIAGNOSIS — Z800 Family history of malignant neoplasm of digestive organs: Secondary | ICD-10-CM | POA: Diagnosis not present

## 2021-07-23 DIAGNOSIS — Z947 Corneal transplant status: Secondary | ICD-10-CM | POA: Diagnosis not present

## 2021-07-23 DIAGNOSIS — H16002 Unspecified corneal ulcer, left eye: Secondary | ICD-10-CM | POA: Diagnosis not present

## 2021-07-23 DIAGNOSIS — H04129 Dry eye syndrome of unspecified lacrimal gland: Secondary | ICD-10-CM | POA: Diagnosis not present

## 2021-08-04 ENCOUNTER — Telehealth: Payer: Self-pay | Admitting: Family Medicine

## 2021-08-04 NOTE — Telephone Encounter (Signed)
Copied from [place] [id number]. Topic: Medicare AWV >> Aug 04, 2021 12:25 PM Cher Nakai R wrote: Reason for CRM:  Left message for patient to call back and schedule Medicare Annual Wellness Visit (AWV) to be completed by video or phone.   Last AWV: 11/27/2019  Please schedule at anytime with [place]  45 minute appointment  Any questions, please contact me at [phone]

## 2021-08-04 NOTE — Telephone Encounter (Signed)
Left message for patient to call back and schedule Medicare Annual Wellness Visit (AWV) in office.   If not able to come in office, please offer to do virtually or by telephone.   Last AWV: 11/27/2019  Please schedule at anytime with [facility] Health Advisor.  If any questions, please contact me at [phone]      Disregard note that states [facility]  should be for [facility]

## 2021-08-08 DIAGNOSIS — Z79899 Other long term (current) drug therapy: Secondary | ICD-10-CM | POA: Diagnosis not present

## 2021-08-08 DIAGNOSIS — M0579 Rheumatoid arthritis with rheumatoid factor of multiple sites without organ or systems involvement: Secondary | ICD-10-CM | POA: Diagnosis not present

## 2021-08-13 ENCOUNTER — Encounter: Payer: Medicare Other | Admitting: Family Medicine

## 2021-08-29 ENCOUNTER — Ambulatory Visit: Admit: 2021-08-29 | Payer: Medicare Other | Admitting: Gastroenterology

## 2021-08-29 SURGERY — COLONOSCOPY
Anesthesia: General

## 2021-09-17 ENCOUNTER — Ambulatory Visit: Payer: Medicare Other | Admitting: Dermatology

## 2021-09-22 ENCOUNTER — Other Ambulatory Visit: Payer: Self-pay | Admitting: Family Medicine

## 2021-09-22 DIAGNOSIS — E039 Hypothyroidism, unspecified: Secondary | ICD-10-CM

## 2021-11-06 DIAGNOSIS — Z79899 Other long term (current) drug therapy: Secondary | ICD-10-CM | POA: Diagnosis not present

## 2021-11-06 DIAGNOSIS — M0579 Rheumatoid arthritis with rheumatoid factor of multiple sites without organ or systems involvement: Secondary | ICD-10-CM | POA: Diagnosis not present

## 2021-11-06 DIAGNOSIS — I4891 Unspecified atrial fibrillation: Secondary | ICD-10-CM | POA: Diagnosis not present

## 2021-11-06 DIAGNOSIS — H16002 Unspecified corneal ulcer, left eye: Secondary | ICD-10-CM | POA: Diagnosis not present

## 2021-11-06 DIAGNOSIS — I429 Cardiomyopathy, unspecified: Secondary | ICD-10-CM | POA: Diagnosis not present

## 2021-11-11 DIAGNOSIS — G4733 Obstructive sleep apnea (adult) (pediatric): Secondary | ICD-10-CM | POA: Diagnosis not present

## 2021-12-03 ENCOUNTER — Ambulatory Visit: Payer: Medicare Other | Admitting: Family Medicine

## 2021-12-08 ENCOUNTER — Ambulatory Visit: Payer: Medicare Other | Admitting: Dermatology

## 2021-12-08 DIAGNOSIS — Z1283 Encounter for screening for malignant neoplasm of skin: Secondary | ICD-10-CM | POA: Diagnosis not present

## 2021-12-08 DIAGNOSIS — L821 Other seborrheic keratosis: Secondary | ICD-10-CM | POA: Diagnosis not present

## 2021-12-08 DIAGNOSIS — D229 Melanocytic nevi, unspecified: Secondary | ICD-10-CM

## 2021-12-08 DIAGNOSIS — D1800 Hemangioma unspecified site: Secondary | ICD-10-CM

## 2021-12-08 DIAGNOSIS — D485 Neoplasm of uncertain behavior of skin: Secondary | ICD-10-CM | POA: Diagnosis not present

## 2021-12-08 DIAGNOSIS — D691 Qualitative platelet defects: Secondary | ICD-10-CM

## 2021-12-08 DIAGNOSIS — L820 Inflamed seborrheic keratosis: Secondary | ICD-10-CM | POA: Diagnosis not present

## 2021-12-08 DIAGNOSIS — L578 Other skin changes due to chronic exposure to nonionizing radiation: Secondary | ICD-10-CM | POA: Diagnosis not present

## 2021-12-08 DIAGNOSIS — L814 Other melanin hyperpigmentation: Secondary | ICD-10-CM

## 2021-12-08 DIAGNOSIS — L570 Actinic keratosis: Secondary | ICD-10-CM

## 2021-12-08 MED ORDER — FLUOROURACIL 5 % EX CREA: TOPICAL_CREAM | Freq: Two times a day (BID) | CUTANEOUS | 1 refills | Status: DC

## 2021-12-08 NOTE — Progress Notes (Signed)
? ?Follow-Up Visit ?  ?Subjective  ?John Baxter is a 71 y.o. male who presents for the following: Annual Exam (History of AK - TBSE today). ?The patient presents for Total-Body Skin Exam (TBSE) for skin cancer screening and mole check.  The patient has spots, moles and lesions to be evaluated, some may be new or changing and the patient has concerns that these could be cancer. ? ?The following portions of the chart were reviewed this encounter and updated as appropriate:  ? Tobacco  Allergies  Meds  Problems  Med Hx  Surg Hx  Fam Hx   ?  ?Review of Systems:  No other skin or systemic complaints except as noted in HPI or Assessment and Plan. ? ?Objective  ?Well appearing patient in no apparent distress; mood and affect are within normal limits. ? ?A full examination was performed including scalp, head, eyes, ears, nose, lips, neck, chest, axillae, abdomen, back, buttocks, bilateral upper extremities, bilateral lower extremities, hands, feet, fingers, toes, fingernails, and toenails. All findings within normal limits unless otherwise noted below. ? ?Right Forearm - Posterior ?Erythematous stuck-on, waxy papule or plaque ? ?Arms/hands x 11, face x 1, right ear x 1 (13) ?Erythematous thin papules/macules with gritty scale.  ? ?Right Upper Back Paraspinal ?Pink papule 1.1 x 0.7 cm ? ? ? ? ? ? ? ?Assessment & Plan  ? ?Purpura - Chronic; persistent and recurrent.  Treatable, but not curable. ?- Violaceous macules and patches ?- Benign ?- Related to trauma, age, sun damage and/or use of blood thinners, chronic use of topical and/or oral steroids ?- Observe ?- Can use OTC arnica containing moisturizer such as Dermend Bruise Formula if desired ?- Call for worsening or other concerns ? ?Lentigines ?- Scattered tan macules ?- Due to sun exposure ?- Benign-appearing, observe ?- Recommend daily broad spectrum sunscreen SPF 30+ to sun-exposed areas, reapply every 2 hours as needed. ?- Call for any  changes ? ?Seborrheic Keratoses ?- Stuck-on, waxy, tan-brown papules and/or plaques  ?- Benign-appearing ?- Discussed benign etiology and prognosis. ?- Observe ?- Call for any changes ? ?Melanocytic Nevi ?- Tan-brown and/or pink-flesh-colored symmetric macules and papules ?- Benign appearing on exam today ?- Observation ?- Call clinic for new or changing moles ?- Recommend daily use of broad spectrum spf 30+ sunscreen to sun-exposed areas.  ? ?Hemangiomas ?- Red papules ?- Discussed benign nature ?- Observe ?- Call for any changes ? ?Skin cancer screening performed today. ? ?Inflamed seborrheic keratosis ?Right Forearm - Posterior ?Destruction of lesion - Right Forearm - Posterior ?Complexity: simple   ?Destruction method: cryotherapy   ?Informed consent: discussed and consent obtained   ?Timeout:  patient name, date of birth, surgical site, and procedure verified ?Lesion destroyed using liquid nitrogen: Yes   ?Region frozen until ice ball extended beyond lesion: Yes   ?Outcome: patient tolerated procedure well with no complications   ?Post-procedure details: wound care instructions given   ? ?AK (actinic keratosis) (13) ?Arms/hands x 11, face x 1, right ear x 1 ?Actinic Damage - Severe, confluent actinic changes with pre-cancerous actinic keratoses  ?- Severe, chronic, not at goal, secondary to cumulative UV radiation exposure over time ?- diffuse scaly erythematous macules and papules with underlying dyspigmentation ?- Discussed Prescription "Field Treatment" for Severe, Chronic Confluent Actinic Changes with Pre-Cancerous Actinic Keratoses ?Field treatment involves treatment of an entire area of skin that has confluent Actinic Changes (Sun/ Ultraviolet light damage) and PreCancerous Actinic Keratoses by method of PhotoDynamic Therapy (PDT)  and/or prescription Topical Chemotherapy agents such as 5-fluorouracil, 5-fluorouracil/calcipotriene, and/or imiquimod.  The purpose is to decrease the number of clinically  evident and subclinical PreCancerous lesions to prevent progression to development of skin cancer by chemically destroying early precancer changes that may or may not be visible.  It has been shown to reduce the risk of developing skin cancer in the treated area. As a result of treatment, redness, scaling, crusting, and open sores may occur during treatment course. One or more than one of these methods may be used and may have to be used several times to control, suppress and eliminate the PreCancerous changes. Discussed treatment course, expected reaction, and possible side effects. ?- Recommend daily broad spectrum sunscreen SPF 30+ to sun-exposed areas, reapply every 2 hours as needed.  ?- Staying in the shade or wearing long sleeves, sun glasses (UVA+UVB protection) and wide brim hats (4-inch brim around the entire circumference of the hat) are also recommended. ?- Call for new or changing lesions.  ? ?Start Fluorouracil 5%/Calcipotriene cream bid x 1 week to temples and cheeks ? ?Destruction of lesion - Arms/hands x 11, face x 1, right ear x 1 ?Complexity: simple   ?Destruction method: cryotherapy   ?Informed consent: discussed and consent obtained   ?Timeout:  patient name, date of birth, surgical site, and procedure verified ?Lesion destroyed using liquid nitrogen: Yes   ?Region frozen until ice ball extended beyond lesion: Yes   ?Outcome: patient tolerated procedure well with no complications   ?Post-procedure details: wound care instructions given   ? ?fluorouracil (EFUDEX) 5 % cream - Arms/hands x 11, face x 1, right ear x 1 ?Apply topically 2 (two) times daily. Apply to temples and cheeks x 1 week ? ?Neoplasm of uncertain behavior of skin ?Right Upper Back Paraspinal ?Epidermal / dermal shaving ? ?Lesion diameter (cm):  1.1 ?Informed consent: discussed and consent obtained   ?Timeout: patient name, date of birth, surgical site, and procedure verified   ?Procedure prep:  Patient was prepped and draped in  usual sterile fashion ?Prep type:  Isopropyl alcohol ?Anesthesia: the lesion was anesthetized in a standard fashion   ?Anesthetic:  1% lidocaine w/ epinephrine 1-100,000 buffered w/ 8.4% NaHCO3 ?Instrument used: flexible razor blade   ?Hemostasis achieved with: pressure, aluminum chloride and electrodesiccation   ?Outcome: patient tolerated procedure well   ?Post-procedure details: sterile dressing applied and wound care instructions given   ?Dressing type: bandage and petrolatum   ? ?Destruction of lesion ?Complexity: extensive   ?Destruction method: electrodesiccation and curettage   ?Informed consent: discussed and consent obtained   ?Timeout:  patient name, date of birth, surgical site, and procedure verified ?Procedure prep:  Patient was prepped and draped in usual sterile fashion ?Prep type:  Isopropyl alcohol ?Anesthesia: the lesion was anesthetized in a standard fashion   ?Anesthetic:  1% lidocaine w/ epinephrine 1-100,000 buffered w/ 8.4% NaHCO3 ?Curettage performed in three different directions: Yes   ?Electrodesiccation performed over the curetted area: Yes   ?Hemostasis achieved with:  pressure and aluminum chloride ?Outcome: patient tolerated procedure well with no complications   ?Post-procedure details: sterile dressing applied and wound care instructions given   ?Dressing type: bandage and petrolatum   ? ?Specimen 1 - Surgical pathology ?Differential Diagnosis: Inflamed SK R/O Ca ?Check Margins: No ?EDC today ? ?Return in about 6 months (around 06/09/2022) for Follow up. ? ?I, Ashok Cordia, CMA, am acting as scribe for Sarina Ser, MD . ?Documentation: I have reviewed the above documentation  for accuracy and completeness, and I agree with the above. ? ?Sarina Ser, MD ? ?

## 2021-12-08 NOTE — Patient Instructions (Addendum)
Cryotherapy Aftercare  Wash gently with soap and water everyday.   Apply Vaseline and Band-Aid daily until healed.    Wound Care Instructions  Cleanse wound gently with soap and water once a day then pat dry with clean gauze. Apply a thing coat of Petrolatum (petroleum jelly, "Vaseline") over the wound (unless you have an allergy to this). We recommend that you use a new, sterile tube of Vaseline. Do not pick or remove scabs. Do not remove the yellow or white "healing tissue" from the base of the wound.  Cover the wound with fresh, clean, nonstick gauze and secure with paper tape. You may use Band-Aids in place of gauze and tape if the would is small enough, but would recommend trimming much of the tape off as there is often too much. Sometimes Band-Aids can irritate the skin.  You should call the office for your biopsy report after 1 week if you have not already been contacted.  If you experience any problems, such as abnormal amounts of bleeding, swelling, significant bruising, significant pain, or evidence of infection, please call the office immediately.  FOR ADULT SURGERY PATIENTS: If you need something for pain relief you may take 1 extra strength Tylenol (acetaminophen) AND 2 Ibuprofen (200mg each) together every 4 hours as needed for pain. (do not take these if you are allergic to them or if you have a reason you should not take them.) Typically, you may only need pain medication for 1 to 3 days.        If You Need Anything After Your Visit  If you have any questions or concerns for your doctor, please call our main line at [phone] and press option 4 to reach your doctor's medical assistant. If no one answers, please leave a voicemail as directed and we will return your call as soon as possible. Messages left after 4 pm will be answered the following business day.   You may also send us a message via MyChart. We typically respond to MyChart messages within 1-2 business  days.  For prescription refills, please ask your pharmacy to contact our office. Our fax number is [phone].  If you have an urgent issue when the clinic is closed that cannot wait until the next business day, you can page your doctor at the number below.    Please note that while we do our best to be available for urgent issues outside of office hours, we are not available 24/7.   If you have an urgent issue and are unable to reach us, you may choose to seek medical care at your doctor's office, retail clinic, urgent care center, or emergency room.  If you have a medical emergency, please immediately call 911 or go to the emergency department.  Pager Numbers  - Dr. Kowalski: [phone]  - Dr. Moye: [phone]  - Dr. Stewart: [phone]  In the event of inclement weather, please call our main line at [phone] for an update on the status of any delays or closures.  Dermatology Medication Tips: Please keep the boxes that topical medications come in in order to help keep track of the instructions about where and how to use these. Pharmacies typically print the medication instructions only on the boxes and not directly on the medication tubes.   If your medication is too expensive, please contact our office at [phone] option 4 or send us a message through MyChart.   We are unable to tell what your co-pay for medications will be   in advance as this is different depending on your insurance coverage. However, we may be able to find a substitute medication at lower cost or fill out paperwork to get insurance to cover a needed medication.   If a prior authorization is required to get your medication covered by your insurance company, please allow us 1-2 business days to complete this process.  Drug prices often vary depending on where the prescription is filled and some pharmacies may offer cheaper prices.  The website www.goodrx.com contains coupons for medications through  different pharmacies. The prices here do not account for what the cost may be with help from insurance (it may be cheaper with your insurance), but the website can give you the price if you did not use any insurance.  - You can print the associated coupon and take it with your prescription to the pharmacy.  - You may also stop by our office during regular business hours and pick up a GoodRx coupon card.  - If you need your prescription sent electronically to a different pharmacy, notify our office through [facility] MyChart or by phone at [phone] option 4.     Si Usted Necesita Algo Despus de Su Visita  Tambin puede enviarnos un mensaje a travs de MyChart. Por lo general respondemos a los mensajes de MyChart en el transcurso de 1 a 2 das hbiles.  Para renovar recetas, por favor pida a su farmacia que se ponga en contacto con nuestra oficina. Nuestro nmero de fax es el [phone].  Si tiene un asunto urgente cuando la clnica est cerrada y que no puede esperar hasta el siguiente da hbil, puede llamar/localizar a su doctor(a) al nmero que aparece a continuacin.   Por favor, tenga en cuenta que aunque hacemos todo lo posible para estar disponibles para asuntos urgentes fuera del horario de oficina, no estamos disponibles las 24 horas del da, los 7 das de la semana.   Si tiene un problema urgente y no puede comunicarse con nosotros, puede optar por buscar atencin mdica  en el consultorio de su doctor(a), en una clnica privada, en un centro de atencin urgente o en una sala de emergencias.  Si tiene una emergencia mdica, por favor llame inmediatamente al 911 o vaya a la sala de emergencias.  Nmeros de bper  - Dr. Kowalski: [phone]  - Dra. Moye: [phone]  - Dra. Stewart: [phone]  En caso de inclemencias del tiempo, por favor llame a nuestra lnea principal al [phone] para una actualizacin sobre el estado de cualquier retraso o cierre.  Consejos  para la medicacin en dermatologa: Por favor, guarde las cajas en las que vienen los medicamentos de uso tpico para ayudarle a seguir las instrucciones sobre dnde y cmo usarlos. Las farmacias generalmente imprimen las instrucciones del medicamento slo en las cajas y no directamente en los tubos del medicamento.   Si su medicamento es muy caro, por favor, pngase en contacto con nuestra oficina llamando al [phone] y presione la opcin 4 o envenos un mensaje a travs de MyChart.   No podemos decirle cul ser su copago por los medicamentos por adelantado ya que esto es diferente dependiendo de la cobertura de su seguro. Sin embargo, es posible que podamos encontrar un medicamento sustituto a menor costo o llenar un formulario para que el seguro cubra el medicamento que se considera necesario.   Si se requiere una autorizacin previa para que su compaa de seguros cubra su medicamento, por favor permtanos de 1 a   2 das hbiles para completar este proceso.  Los precios de los medicamentos varan con frecuencia dependiendo del lugar de dnde se surte la receta y alguna farmacias pueden ofrecer precios ms baratos.  El sitio web www.goodrx.com tiene cupones para medicamentos de diferentes farmacias. Los precios aqu no tienen en cuenta lo que podra costar con la ayuda del seguro (puede ser ms barato con su seguro), pero el sitio web puede darle el precio si no utiliz ningn seguro.  - Puede imprimir el cupn correspondiente y llevarlo con su receta a la farmacia.  - Tambin puede pasar por nuestra oficina durante el horario de atencin regular y recoger una tarjeta de cupones de GoodRx.  - Si necesita que su receta se enve electrnicamente a una farmacia diferente, informe a nuestra oficina a travs de MyChart de [place] o por telfono llamando al [phone] y presione la opcin 4.  

## 2021-12-10 ENCOUNTER — Telehealth: Payer: Self-pay | Admitting: Family Medicine

## 2021-12-10 DIAGNOSIS — G4733 Obstructive sleep apnea (adult) (pediatric): Secondary | ICD-10-CM | POA: Diagnosis not present

## 2021-12-10 DIAGNOSIS — I482 Chronic atrial fibrillation, unspecified: Secondary | ICD-10-CM | POA: Diagnosis not present

## 2021-12-10 DIAGNOSIS — E782 Mixed hyperlipidemia: Secondary | ICD-10-CM | POA: Diagnosis not present

## 2021-12-10 DIAGNOSIS — I340 Nonrheumatic mitral (valve) insufficiency: Secondary | ICD-10-CM | POA: Diagnosis not present

## 2021-12-10 DIAGNOSIS — R55 Syncope and collapse: Secondary | ICD-10-CM | POA: Diagnosis not present

## 2021-12-10 DIAGNOSIS — I071 Rheumatic tricuspid insufficiency: Secondary | ICD-10-CM | POA: Diagnosis not present

## 2021-12-10 DIAGNOSIS — I428 Other cardiomyopathies: Secondary | ICD-10-CM | POA: Diagnosis not present

## 2021-12-10 NOTE — Telephone Encounter (Signed)
Copied from [place] #[id number]. Topic: Medicare AWV ?>> Dec 10, 2021 11:19 AM Cher Nakai R wrote: ?Reason for CRM:  ?Left message for patient to call back and schedule Medicare Annual Wellness Visit (AWV) in office.  ? ?If unable to come into the office for AWV,  please offer to do virtually or by telephone. ? ?Last AWV: 11/27/2019 ? ?Please schedule at anytime with [facility] Health Advisor. ? ?30 minute appointment for Virtual or phone ?45 minute appointment for in office or Initial virtual/phone ? ?Any questions, please contact me at [phone] ?

## 2021-12-11 ENCOUNTER — Telehealth: Payer: Self-pay

## 2021-12-11 NOTE — Telephone Encounter (Signed)
-----   Message from Ralene Bathe, MD sent at 12/11/2021  1:42 PM EDT ----- ?Diagnosis ?Skin , right upper back paraspinal ?SEBORRHEIC KERATOSIS, INFLAMED ? ?Benign inflamed keratosis ?No further treatment needed ?

## 2021-12-11 NOTE — Telephone Encounter (Signed)
Left message for patient to call office for results/hd 

## 2021-12-15 ENCOUNTER — Telehealth: Payer: Self-pay

## 2021-12-15 NOTE — Telephone Encounter (Signed)
-----   Message from Ralene Bathe, MD sent at 12/11/2021  1:42 PM EDT ----- ?Diagnosis ?Skin , right upper back paraspinal ?SEBORRHEIC KERATOSIS, INFLAMED ? ?Benign inflamed keratosis ?No further treatment needed ?

## 2021-12-15 NOTE — Telephone Encounter (Signed)
Patient advised of BX results .aw 

## 2021-12-16 ENCOUNTER — Encounter: Payer: Self-pay | Admitting: Dermatology

## 2021-12-17 ENCOUNTER — Ambulatory Visit (INDEPENDENT_AMBULATORY_CARE_PROVIDER_SITE_OTHER): Payer: Medicare Other

## 2021-12-17 VITALS — Wt 219.0 lb

## 2021-12-17 DIAGNOSIS — H04129 Dry eye syndrome of unspecified lacrimal gland: Secondary | ICD-10-CM | POA: Diagnosis not present

## 2021-12-17 DIAGNOSIS — Z0000 Encounter for general adult medical examination without abnormal findings: Secondary | ICD-10-CM | POA: Diagnosis not present

## 2021-12-17 DIAGNOSIS — H25091 Other age-related incipient cataract, right eye: Secondary | ICD-10-CM | POA: Diagnosis not present

## 2021-12-17 DIAGNOSIS — H16002 Unspecified corneal ulcer, left eye: Secondary | ICD-10-CM | POA: Diagnosis not present

## 2021-12-17 NOTE — Patient Instructions (Signed)
John Baxter , ?Thank you for taking time to come for your Medicare Wellness Visit. I appreciate your ongoing commitment to your health goals. Please review the following plan we discussed and let me know if I can assist you in the future.  ? ?Screening recommendations/referrals: ?Colonoscopy: FOBT: 12/14/19 ?Recommended yearly ophthalmology/optometry visit for glaucoma screening and checkup ?Recommended yearly dental visit for hygiene and checkup ? ?Vaccinations: ?Influenza vaccine: 06/04/21 ?Pneumococcal vaccine: 08/05/16 ?Tdap vaccine: 05/11/19 ?Shingles vaccine: n/d   ?Covid-19: 09/30/19, 10/21/19 ? ?Advanced directives: yes, requested copy ? ?Conditions/risks identified: no ? ?Next appointment: Follow up in one year for your annual wellness visit. - declined ? ?Preventive Care 71 Years and Older, Male ?Preventive care refers to lifestyle choices and visits with your health care provider that can promote health and wellness. ?What does preventive care include? ?A yearly physical exam. This is also called an annual well check. ?Dental exams once or twice a year. ?Routine eye exams. Ask your health care provider how often you should have your eyes checked. ?Personal lifestyle choices, including: ?Daily care of your teeth and gums. ?Regular physical activity. ?Eating a healthy diet. ?Avoiding tobacco and drug use. ?Limiting alcohol use. ?Practicing safe sex. ?Taking low doses of aspirin every day. ?Taking vitamin and mineral supplements as recommended by your health care provider. ?What happens during an annual well check? ?The services and screenings done by your health care provider during your annual well check will depend on your age, overall health, lifestyle risk factors, and family history of disease. ?Counseling  ?Your health care provider may ask you questions about your: ?Alcohol use. ?Tobacco use. ?Drug use. ?Emotional well-being. ?Home and relationship well-being. ?Sexual activity. ?Eating habits. ?History of  falls. ?Memory and ability to understand (cognition). ?Work and work Statistician. ?Screening  ?You may have the following tests or measurements: ?Height, weight, and BMI. ?Blood pressure. ?Lipid and cholesterol levels. These may be checked every 5 years, or more frequently if you are over 19 years old. ?Skin check. ?Lung cancer screening. You may have this screening every year starting at age 49 if you have a 30-pack-year history of smoking and currently smoke or have quit within the past 15 years. ?Fecal occult blood test (FOBT) of the stool. You may have this test every year starting at age 42. ?Flexible sigmoidoscopy or colonoscopy. You may have a sigmoidoscopy every 5 years or a colonoscopy every 10 years starting at age 67. ?Prostate cancer screening. Recommendations will vary depending on your family history and other risks. ?Hepatitis C blood test. ?Hepatitis B blood test. ?Sexually transmitted disease (STD) testing. ?Diabetes screening. This is done by checking your blood sugar (glucose) after you have not eaten for a while (fasting). You may have this done every 1-3 years. ?Abdominal aortic aneurysm (AAA) screening. You may need this if you are a current or former smoker. ?Osteoporosis. You may be screened starting at age 4 if you are at high risk. ?Talk with your health care provider about your test results, treatment options, and if necessary, the need for more tests. ?Vaccines  ?Your health care provider may recommend certain vaccines, such as: ?Influenza vaccine. This is recommended every year. ?Tetanus, diphtheria, and acellular pertussis (Tdap, Td) vaccine. You may need a Td booster every 10 years. ?Zoster vaccine. You may need this after age 68. ?Pneumococcal 13-valent conjugate (PCV13) vaccine. One dose is recommended after age 71. ?Pneumococcal polysaccharide (PPSV23) vaccine. One dose is recommended after age 71. ?Talk to your health care provider about  which screenings and vaccines you need and  how often you need them. ?This information is not intended to replace advice given to you by your health care provider. Make sure you discuss any questions you have with your health care provider. ?Document Released: 08/30/2015 Document Revised: 04/22/2016 Document Reviewed: 06/04/2015 ?Elsevier Interactive Patient Education ? 2017 [place]. ? ?Fall Prevention in the Home ?Falls can cause injuries. They can happen to people of all ages. There are many things you can do to make your home safe and to help prevent falls. ?What can I do on the outside of my home? ?Regularly fix the edges of walkways and driveways and fix any cracks. ?Remove anything that might make you trip as you walk through a door, such as a raised step or threshold. ?Trim any bushes or trees on the path to your home. ?Use bright outdoor lighting. ?Clear any walking paths of anything that might make someone trip, such as rocks or tools. ?Regularly check to see if handrails are loose or broken. Make sure that both sides of any steps have handrails. ?Any raised decks and porches should have guardrails on the edges. ?Have any leaves, snow, or ice cleared regularly. ?Use sand or salt on walking paths during winter. ?Clean up any spills in your garage right away. This includes oil or grease spills. ?What can I do in the bathroom? ?Use night lights. ?Install grab bars by the toilet and in the tub and shower. Do not use towel bars as grab bars. ?Use non-skid mats or decals in the tub or shower. ?If you need to sit down in the shower, use a plastic, non-slip stool. ?Keep the floor dry. Clean up any water that spills on the floor as soon as it happens. ?Remove soap buildup in the tub or shower regularly. ?Attach bath mats securely with double-sided non-slip rug tape. ?Do not have throw rugs and other things on the floor that can make you trip. ?What can I do in the bedroom? ?Use night lights. ?Make sure that you have a light by your bed that is easy to  reach. ?Do not use any sheets or blankets that are too big for your bed. They should not hang down onto the floor. ?Have a firm chair that has side arms. You can use this for support while you get dressed. ?Do not have throw rugs and other things on the floor that can make you trip. ?What can I do in the kitchen? ?Clean up any spills right away. ?Avoid walking on wet floors. ?Keep items that you use a lot in easy-to-reach places. ?If you need to reach something above you, use a strong step stool that has a grab bar. ?Keep electrical cords out of the way. ?Do not use floor polish or wax that makes floors slippery. If you must use wax, use non-skid floor wax. ?Do not have throw rugs and other things on the floor that can make you trip. ?What can I do with my stairs? ?Do not leave any items on the stairs. ?Make sure that there are handrails on both sides of the stairs and use them. Fix handrails that are broken or loose. Make sure that handrails are as long as the stairways. ?Check any carpeting to make sure that it is firmly attached to the stairs. Fix any carpet that is loose or worn. ?Avoid having throw rugs at the top or bottom of the stairs. If you do have throw rugs, attach them to the floor with  carpet tape. ?Make sure that you have a light switch at the top of the stairs and the bottom of the stairs. If you do not have them, ask someone to add them for you. ?What else can I do to help prevent falls? ?Wear shoes that: ?Do not have high heels. ?Have rubber bottoms. ?Are comfortable and fit you well. ?Are closed at the toe. Do not wear sandals. ?If you use a stepladder: ?Make sure that it is fully opened. Do not climb a closed stepladder. ?Make sure that both sides of the stepladder are locked into place. ?Ask someone to hold it for you, if possible. ?Clearly mark and make sure that you can see: ?Any grab bars or handrails. ?First and last steps. ?Where the edge of each step is. ?Use tools that help you move  around (mobility aids) if they are needed. These include: ?Canes. ?Walkers. ?Scooters. ?Crutches. ?Turn on the lights when you go into a dark area. Replace any light bulbs as soon as they burn out. ?Set up y

## 2021-12-17 NOTE — Progress Notes (Signed)
Virtual Visit via Telephone Note  I connected with  John Baxter on 12/17/21 at  3:30 PM EDT by telephone and verified that I am speaking with the correct person using two identifiers.  Location: Patient: home Provider: [facility] Persons participating in the virtual visit: [place]   I discussed the limitations, risks, security and privacy concerns of performing an evaluation and management service by telephone and the availability of in person appointments. The patient expressed understanding and agreed to proceed.  Interactive audio and video telecommunications were attempted between this nurse and patient, however failed, due to patient having technical difficulties OR patient did not have access to video capability.  We continued and completed visit with audio only.  Some vital signs may be absent or patient reported.   Dionisio David, LPN  Subjective:   John Baxter is a 71 y.o. male who presents for Medicare Annual/Subsequent preventive examination.  Review of Systems           Objective:    Today's Vitals   12/17/21 1526  Weight: 219 lb (99.3 kg)   Body mass index is 31.42 kg/m.     11/27/2019    9:16 AM 08/05/2015    9:12 AM  Advanced Directives  Does Patient Have a Medical Advance Directive? Yes No  Type of Paramedic of [place];Living will   Copy of [place] in Chart? No - copy requested     Current Medications (verified) Outpatient Encounter Medications as of 12/17/2021  Medication Sig   Ganciclovir (ZIRGAN) 0.15 % GEL Apply to eye.   prednisoLONE acetate (PRED FORTE) 1 % ophthalmic suspension INSTILL 1 DROP IN THE LEFT EYE DAILY   valACYclovir (VALTREX) 1000 MG tablet Take 1 tablet by mouth 3 (three) times daily.   aspirin 81 MG chewable tablet Chew 81 mg by mouth daily.    benzonatate (TESSALON) 200 MG capsule Take 1 capsule (200 mg total) by mouth 2 (two) times daily as needed for  cough.   fluorouracil (EFUDEX) 5 % cream Apply topically 2 (two) times daily. Apply to temples and cheeks x 1 week   folic acid (FOLVITE) 1 MG tablet Take 1 mg by mouth daily.    HUMIRA PEN 40 MG/0.4ML PNKT Inject 1 pen/syringe subcutaneously every other week   ibuprofen (ADVIL) 800 MG tablet    levothyroxine (SYNTHROID) 100 MCG tablet TAKE 1 TABLET DAILY   methotrexate 2.5 MG tablet Take 10 mg by mouth. Takes 4 tablets once a week   metoprolol succinate (TOPROL-XL) 25 MG 24 hr tablet Take 25 mg by mouth 2 (two) times daily.    prednisoLONE acetate (PRED FORTE) 1 % ophthalmic suspension    predniSONE (STERAPRED UNI-PAK 21 TAB) 10 MG (21) TBPK tablet Taper as directed on the package   RESTASIS 0.05 % ophthalmic emulsion Place 1 drop into the right eye 3 (three) times daily.    valACYclovir (VALTREX) 1000 MG tablet Take 1,000 mg by mouth 3 (three) times daily.   ZIRGAN 0.15 % GEL    No facility-administered encounter medications on file as of 12/17/2021.    Allergies (verified) Patient has no known allergies.   History: Past Medical History:  Diagnosis Date   Actinic keratosis 01/02/2015   Right post. lat. neck. Hypertrophic.   Hypertension    Past Surgical History:  Procedure Laterality Date   CORNEAL TRANSPLANT     TOE SURGERY     Ingrown nail x2   Family  History  Problem Relation Age of Onset   Stroke Mother    Arthritis Mother    Heart disease Father    Cancer Father        lung   Blindness Father 56   Social History   Socioeconomic History   Marital status: Married    Spouse name: Not on file   Number of children: 0   Years of education: Not on file   Highest education level: Master's degree (e.g., MA, MS, MEng, MEd, MSW, MBA)  Occupational History    Comment: Pastoral care pastor  Tobacco Use   Smoking status: Never   Smokeless tobacco: Never  Vaping Use   Vaping Use: Never used  Substance and Sexual Activity   Alcohol use: Yes    Alcohol/week: 3.0  standard drinks    Types: 3 Cans of beer per week   Drug use: No   Sexual activity: Not on file  Other Topics Concern   Not on file  Social History Narrative   Not on file   Social Determinants of Health   Financial Resource Strain: Not on file  Food Insecurity: Not on file  Transportation Needs: Not on file  Physical Activity: Not on file  Stress: Not on file  Social Connections: Not on file    Tobacco Counseling Counseling given: Not Answered   Clinical Intake:  Pre-visit preparation completed: Yes  Pain : No/denies pain     Nutritional Risks: None Diabetes: No  How often do you need to have someone help you when you read instructions, pamphlets, or other written materials from your doctor or pharmacy?: 1 - Never  Diabetic?no  Interpreter Needed?: No  Information entered by :: Kirke Shaggy, LPN   Activities of Daily Living    06/04/2021   10:10 AM  In your present state of health, do you have any difficulty performing the following activities:  Hearing? 0  Vision? 0  Difficulty concentrating or making decisions? 0  Walking or climbing stairs? 0  Dressing or bathing? 0  Doing errands, shopping? 0    Patient Care Team: Jerrol Banana., MD as PCP - General (Family Medicine) Ramonita Lab, Jovita Kussmaul, MD as Referring Physician (Ophthalmology) Emmaline Kluver., MD (Rheumatology) Corey Skains, MD as Consulting Physician (Cardiology) Ralene Bathe, MD (Dermatology)  Indicate any recent Medical Services you may have received from other than [facility] providers in the past year (date may be approximate).     Assessment:   This is a routine wellness examination for John Baxter.  Hearing/Vision screen No results found.  Dietary issues and exercise activities discussed:     Goals Addressed   None    Depression Screen    06/04/2021   10:09 AM 07/04/2020    4:10 PM 11/27/2019    9:13 AM 10/26/2018    9:17 AM 10/06/2017   10:21  AM 08/05/2016    9:54 AM  PHQ 2/9 Scores  PHQ - 2 Score 0 0 0 0 0 0  PHQ- 9 Score 0 0  0 0     Fall Risk    06/04/2021   10:09 AM 07/04/2020    4:09 PM 11/27/2019    9:16 AM 10/26/2018    9:16 AM 10/06/2017   10:21 AM  Fall Risk   Falls in the past year? 1 0 0 0 No  Number falls in past yr: 0 0 0    Injury with Fall? 0 0 0  Follow up  Falls evaluation completed       FALL RISK PREVENTION PERTAINING TO THE HOME:  Any stairs in or around the home? Yes  If so, are there any without handrails? No  Home free of loose throw rugs in walkways, pet beds, electrical cords, etc? Yes  Adequate lighting in your home to reduce risk of falls? Yes   ASSISTIVE DEVICES UTILIZED TO PREVENT FALLS:  Life alert? No  Use of a cane, walker or w/c? No  Grab bars in the bathroom? Yes  Shower chair or bench in shower? Yes  Elevated toilet seat or a handicapped toilet? No    Cognitive Function:        Immunizations Immunization History  Administered Date(s) Administered   Fluad Quad(high Dose 65+) 05/11/2019, 06/06/2020, 06/04/2021   Influenza, High Dose Seasonal PF 06/24/2016, 07/16/2017, 06/02/2018   Influenza,inj,Quad PF,6+ Mos 08/14/2015   PFIZER(Purple Top)SARS-COV-2 Vaccination 09/30/2019, 10/21/2019   Pneumococcal Conjugate-13 08/05/2016   Td 05/11/2019   Tdap 09/11/2008    TDAP status: Up to date   Flu Vaccine status: Up to date Pneumococcal vaccine status: Declined,  Education has been provided regarding the importance of this vaccine but patient still declined. Advised may receive this vaccine at local pharmacy or Health Dept. Aware to provide a copy of the vaccination record if obtained from local pharmacy or Health Dept. Verbalized acceptance and understanding.   Covid-19 vaccine status: Completed vaccines  Qualifies for Shingles Vaccine? Yes   Zostavax completed No   Shingrix Completed?: No.    Education has been provided regarding the importance of this vaccine.  Patient has been advised to call insurance company to determine out of pocket expense if they have not yet received this vaccine. Advised may also receive vaccine at local pharmacy or Health Dept. Verbalized acceptance and understanding.  Screening Tests Health Maintenance  Topic Date Due   Hepatitis C Screening  Never done   Zoster Vaccines- Shingrix (1 of 2) Never done   Pneumonia Vaccine 58+ Years old (2 - PPSV23 if available, else PCV20) 08/05/2017   COVID-19 Vaccine (3 - Pfizer risk series) 11/18/2019   COLONOSCOPY (Pts 45-58yr Insurance coverage will need to be confirmed)  10/22/2020   INFLUENZA VACCINE  03/17/2022   TETANUS/TDAP  05/10/2029   HPV VACCINES  Aged Out    Health Maintenance  Health Maintenance Due  Topic Date Due   Hepatitis C Screening  Never done   Zoster Vaccines- Shingrix (1 of 2) Never done   Pneumonia Vaccine 71 Years old (2 - PPSV23 if available, else PCV20) 08/05/2017   COVID-19 Vaccine (3 - Pfizer risk series) 11/18/2019   COLONOSCOPY (Pts 45-432yrInsurance coverage will need to be confirmed)  10/22/2020    Colorectal cancer screening: Type of screening: FOBT/FIT. Completed 12/14/19. Repeat every 1 years  Lung Cancer Screening: (Low Dose CT Chest recommended if Age 71-80ears, 30 pack-year currently smoking OR have quit w/in 15years.) does not qualify.   Additional Screening:  Hepatitis C Screening: does qualify; Completed no  Vision Screening: Recommended annual ophthalmology exams for early detection of glaucoma and other disorders of the eye. Is the patient up to date with their annual eye exam?  Yes  Who is the provider or what is the name of the office in which the patient attends annual eye exams[facility] provider, would they like to be referred to a provider to establish care? No .  Dental Screening: Recommended annual dental exams for proper oral hygiene  Community Resource Referral /  Chronic Care Management: CRR required this visit?  No   CCM required this visit?  No      Plan:     I have personally reviewed and noted the following in the patient's chart:   Medical and social history Use of alcohol, tobacco or illicit drugs  Current medications and supplements including opioid prescriptions. Patient is not currently taking opioid prescriptions. Functional ability and status Nutritional status Physical activity Advanced directives List of other physicians Hospitalizations, surgeries, and ER visits in previous 12 months Vitals Screenings to include cognitive, depression, and falls Referrals and appointments  In addition, I have reviewed and discussed with patient certain preventive protocols, quality metrics, and best practice recommendations. A written personalized care plan for preventive services as well as general preventive health recommendations were provided to patient.     Dionisio David, LPN   11/17/3951   Nurse Notes: none

## 2021-12-24 ENCOUNTER — Encounter: Payer: Self-pay | Admitting: Family Medicine

## 2021-12-24 ENCOUNTER — Ambulatory Visit (INDEPENDENT_AMBULATORY_CARE_PROVIDER_SITE_OTHER): Payer: Medicare Other | Admitting: Family Medicine

## 2021-12-24 VITALS — BP 95/62 | HR 72 | Resp 16 | Ht 70.0 in | Wt 209.0 lb

## 2021-12-24 DIAGNOSIS — I4811 Longstanding persistent atrial fibrillation: Secondary | ICD-10-CM

## 2021-12-24 DIAGNOSIS — M069 Rheumatoid arthritis, unspecified: Secondary | ICD-10-CM

## 2021-12-24 DIAGNOSIS — E039 Hypothyroidism, unspecified: Secondary | ICD-10-CM

## 2021-12-24 DIAGNOSIS — E785 Hyperlipidemia, unspecified: Secondary | ICD-10-CM | POA: Diagnosis not present

## 2021-12-24 DIAGNOSIS — I428 Other cardiomyopathies: Secondary | ICD-10-CM

## 2021-12-24 NOTE — Progress Notes (Signed)
?  ? ? ?Established patient visit ? ?I,[name],acting as a scribe for [name], MD.,have documented all relevant documentation on the behalf of [name], MD,as directed by  [name], MD while in the presence of [name], MD. ? ? ?Patient: John Baxter   DOB: 12/04/1950   71 y.o. Male  MRN: [id number] ?Visit Date: 12/24/2021 ? ?Today's healthcare provider: [name], MD  ? ?Chief Complaint  ?Patient presents with  ? Follow-up  ? Hyperlipidemia  ? Hypothyroidism  ? ?Subjective  ?  ?HPI  ?Patient is doing well.  He is planning to retire June 30 of this year.  Has been actively taking care of his wife who has had knee  surgery.  He continues to have eye problems but overall is doing well. ?Lipid/Cholesterol, Follow-up ? ?Last lipid panel Other pertinent labs  ?Lab Results  ?Component Value Date  ? CHOL 172 06/04/2021  ? HDL 50 06/04/2021  ? LDLCALC 107 (H) 06/04/2021  ? TRIG 81 06/04/2021  ? CHOLHDL 3.4 07/05/2020  ? Lab Results  ?Component Value Date  ? ALT 26 06/04/2021  ? AST 29 06/04/2021  ? PLT 188 06/04/2021  ? TSH 2.600 06/04/2021  ?  ? ?He was last seen for this 7 months ago.  ?Management since that visit includes labs checked showing-stable. ? ?The 10-year ASCVD risk score (Arnett DK, et al., 2019) is: 11.1% ? ?--------------------------------------------------------------------------------------------------- ?Hypothyroid, follow-up ? ?Lab Results  ?Component Value Date  ? TSH 2.600 06/04/2021  ? TSH 3.210 07/05/2020  ? TSH 3.250 12/14/2019  ? ? ?Wt Readings from Last 3 Encounters:  ?12/24/21 209 lb (94.8 kg)  ?12/17/21 219 lb (99.3 kg)  ?06/04/21 219 lb (99.3 kg)  ? ? ?He was last seen for hypothyroid 7 months ago.  ?Management since that visit includes; labs checked; no changes were made. ? ?----------------------------------------------------------------------------------------- ? ? ?Medications: ?Outpatient Medications Prior to Visit  ?Medication Sig   ? aspirin 81 MG chewable tablet Chew 81 mg by mouth daily.   ? folic acid (FOLVITE) 1 MG tablet Take 1 mg by mouth daily.   ? Ganciclovir (ZIRGAN) 0.15 % GEL Apply to eye.  ? HUMIRA PEN 40 MG/0.4ML PNKT Inject 1 pen/syringe subcutaneously every other week  ? levothyroxine (SYNTHROID) 100 MCG tablet TAKE 1 TABLET DAILY  ? methotrexate 2.5 MG tablet Take 10 mg by mouth. Takes 4 tablets once a week  ? prednisoLONE acetate (PRED FORTE) 1 % ophthalmic suspension INSTILL 1 DROP IN THE LEFT EYE DAILY  ? prednisoLONE acetate (PRED FORTE) 1 % ophthalmic suspension   ? RESTASIS 0.05 % ophthalmic emulsion Place 1 drop into the right eye 3 (three) times daily.   ? valACYclovir (VALTREX) 1000 MG tablet Take 1,000 mg by mouth 3 (three) times daily.  ? ZIRGAN 0.15 % GEL   ? [DISCONTINUED] benzonatate (TESSALON) 200 MG capsule Take 1 capsule (200 mg total) by mouth 2 (two) times daily as needed for cough. (Patient not taking: Reported on 12/17/2021)  ? [DISCONTINUED] fluorouracil (EFUDEX) 5 % cream Apply topically 2 (two) times daily. Apply to temples and cheeks x 1 week  ? [DISCONTINUED] ibuprofen (ADVIL) 800 MG tablet  (Patient not taking: Reported on 12/17/2021)  ? [DISCONTINUED] metoprolol succinate (TOPROL-XL) 25 MG 24 hr tablet Take 25 mg by mouth 2 (two) times daily.  (Patient not taking: Reported on 12/17/2021)  ? [DISCONTINUED] predniSONE (STERAPRED UNI-PAK 21 TAB) 10 MG (21) TBPK tablet Taper as directed  on the package (Patient not taking: Reported on 12/17/2021)  ? ?No facility-administered medications prior to visit.  ? ? ?Review of Systems  ?Constitutional:  Negative for appetite change, chills and fever.  ?Respiratory:  Negative for chest tightness, shortness of breath and wheezing.   ?Cardiovascular:  Negative for chest pain and palpitations.  ?Gastrointestinal:  Negative for abdominal pain, nausea and vomiting.  ? ?Last thyroid functions ?Lab Results  ?Component Value Date  ? TSH 2.600 06/04/2021  ? ?  ?  Objective  ?  ?BP  95/62 (BP Location: Right Arm, Patient Position: Sitting, Cuff Size: Large)   Pulse 72   Resp 16   Ht '5\' 10"'$  (1.778 m)   Wt 209 lb (94.8 kg)   SpO2 98%   BMI 29.99 kg/m?  ?BP Readings from Last 3 Encounters:  ?12/24/21 95/62  ?06/04/21 116/80  ?07/04/20 106/68  ? ?Wt Readings from Last 3 Encounters:  ?12/24/21 209 lb (94.8 kg)  ?12/17/21 219 lb (99.3 kg)  ?06/04/21 219 lb (99.3 kg)  ? ?  ? ?Physical Exam ?Vitals reviewed.  ?Constitutional:   ?   Appearance: He is well-developed.  ?HENT:  ?   Head: Normocephalic and atraumatic.  ?   Right Ear: External ear normal.  ?   Left Ear: External ear normal.  ?   Nose: Nose normal.  ?Eyes:  ?   General: No scleral icterus. ?   Conjunctiva/sclera: Conjunctivae normal.  ?Neck:  ?   Thyroid: No thyromegaly.  ?Cardiovascular:  ?   Rate and Rhythm: Normal rate and regular rhythm.  ?   Heart sounds: Normal heart sounds.  ?Pulmonary:  ?   Effort: Pulmonary effort is normal.  ?   Breath sounds: Normal breath sounds.  ?Abdominal:  ?   Palpations: Abdomen is soft.  ?Skin: ?   General: Skin is warm and dry.  ?Neurological:  ?   General: No focal deficit present.  ?   Mental Status: He is alert and oriented to person, place, and time.  ?Psychiatric:     ?   Mood and Affect: Mood normal.     ?   Behavior: Behavior normal.     ?   Thought Content: Thought content normal.     ?   Judgment: Judgment normal.  ?  ? ? ?No results found for any visits on 12/24/21. ? Assessment & Plan  ?  ? ?1. Adult hypothyroidism ?Treat for euthyroid TSH. ? ?2. Hyperlipidemia, unspecified hyperlipidemia type ? ? ?3. Rheumatoid arthritis involving both hands, unspecified whether rheumatoid factor present ([place]) ?Followed by rheumatology ?Also eye involvement with this RA ? ?4. Cardiomyopathy, nonischemic ([place]) ? ? ?5. Longstanding persistent atrial fibrillation ([place]) ? ? ? ?No follow-ups on file.  ?   ? ?I, [name], MD, have reviewed all documentation for this visit. The documentation on 12/30/21  for the exam, diagnosis, procedures, and orders are all accurate and complete. ? ? ? ?Meagon Duskin Cranford Mon, MD  ?[facility] ?[phone] (phone) ?[phone] (fax) ? ?[facility] ?

## 2022-03-11 DIAGNOSIS — M85842 Other specified disorders of bone density and structure, left hand: Secondary | ICD-10-CM | POA: Diagnosis not present

## 2022-03-11 DIAGNOSIS — M06842 Other specified rheumatoid arthritis, left hand: Secondary | ICD-10-CM | POA: Diagnosis not present

## 2022-03-11 DIAGNOSIS — H16002 Unspecified corneal ulcer, left eye: Secondary | ICD-10-CM | POA: Diagnosis not present

## 2022-03-11 DIAGNOSIS — M0579 Rheumatoid arthritis with rheumatoid factor of multiple sites without organ or systems involvement: Secondary | ICD-10-CM | POA: Diagnosis not present

## 2022-03-11 DIAGNOSIS — Z79899 Other long term (current) drug therapy: Secondary | ICD-10-CM | POA: Diagnosis not present

## 2022-03-11 DIAGNOSIS — M85841 Other specified disorders of bone density and structure, right hand: Secondary | ICD-10-CM | POA: Diagnosis not present

## 2022-03-11 DIAGNOSIS — M06841 Other specified rheumatoid arthritis, right hand: Secondary | ICD-10-CM | POA: Diagnosis not present

## 2022-03-31 DIAGNOSIS — G4733 Obstructive sleep apnea (adult) (pediatric): Secondary | ICD-10-CM | POA: Diagnosis not present

## 2022-04-22 DIAGNOSIS — H16002 Unspecified corneal ulcer, left eye: Secondary | ICD-10-CM | POA: Diagnosis not present

## 2022-04-22 DIAGNOSIS — Z947 Corneal transplant status: Secondary | ICD-10-CM | POA: Diagnosis not present

## 2022-04-22 DIAGNOSIS — H25091 Other age-related incipient cataract, right eye: Secondary | ICD-10-CM | POA: Diagnosis not present

## 2022-04-22 DIAGNOSIS — H04129 Dry eye syndrome of unspecified lacrimal gland: Secondary | ICD-10-CM | POA: Diagnosis not present

## 2022-04-27 DIAGNOSIS — H25091 Other age-related incipient cataract, right eye: Secondary | ICD-10-CM | POA: Diagnosis not present

## 2022-04-27 DIAGNOSIS — Z947 Corneal transplant status: Secondary | ICD-10-CM | POA: Diagnosis not present

## 2022-04-27 DIAGNOSIS — H04129 Dry eye syndrome of unspecified lacrimal gland: Secondary | ICD-10-CM | POA: Diagnosis not present

## 2022-04-27 DIAGNOSIS — H16002 Unspecified corneal ulcer, left eye: Secondary | ICD-10-CM | POA: Diagnosis not present

## 2022-06-08 ENCOUNTER — Ambulatory Visit: Payer: Medicare Other | Admitting: Dermatology

## 2022-06-08 DIAGNOSIS — L820 Inflamed seborrheic keratosis: Secondary | ICD-10-CM

## 2022-06-08 DIAGNOSIS — L821 Other seborrheic keratosis: Secondary | ICD-10-CM | POA: Diagnosis not present

## 2022-06-08 DIAGNOSIS — L570 Actinic keratosis: Secondary | ICD-10-CM

## 2022-06-08 DIAGNOSIS — L814 Other melanin hyperpigmentation: Secondary | ICD-10-CM

## 2022-06-08 DIAGNOSIS — L578 Other skin changes due to chronic exposure to nonionizing radiation: Secondary | ICD-10-CM | POA: Diagnosis not present

## 2022-06-08 NOTE — Patient Instructions (Signed)
Due to recent changes in healthcare laws, you may see results of your pathology and/or laboratory studies on MyChart before the doctors have had a chance to review them. We understand that in some cases there may be results that are confusing or concerning to you. Please understand that not all results are received at the same time and often the doctors may need to interpret multiple results in order to provide you with the best plan of care or course of treatment. Therefore, we ask that you please give us 2 business days to thoroughly review all your results before contacting the office for clarification. Should we see a critical lab result, you will be contacted sooner.   If You Need Anything After Your Visit  If you have any questions or concerns for your doctor, please call our main line at [phone] and press option 4 to reach your doctor's medical assistant. If no one answers, please leave a voicemail as directed and we will return your call as soon as possible. Messages left after 4 pm will be answered the following business day.   You may also send us a message via MyChart. We typically respond to MyChart messages within 1-2 business days.  For prescription refills, please ask your pharmacy to contact our office. Our fax number is [phone].  If you have an urgent issue when the clinic is closed that cannot wait until the next business day, you can page your doctor at the number below.    Please note that while we do our best to be available for urgent issues outside of office hours, we are not available 24/7.   If you have an urgent issue and are unable to reach us, you may choose to seek medical care at your doctor's office, retail clinic, urgent care center, or emergency room.  If you have a medical emergency, please immediately call 911 or go to the emergency department.  Pager Numbers  - Dr. Kowalski: [phone]  - Dr. Moye: [phone]  - Dr. Stewart:  [phone]  In the event of inclement weather, please call our main line at [phone] for an update on the status of any delays or closures.  Dermatology Medication Tips: Please keep the boxes that topical medications come in in order to help keep track of the instructions about where and how to use these. Pharmacies typically print the medication instructions only on the boxes and not directly on the medication tubes.   If your medication is too expensive, please contact our office at [phone] option 4 or send us a message through MyChart.   We are unable to tell what your co-pay for medications will be in advance as this is different depending on your insurance coverage. However, we may be able to find a substitute medication at lower cost or fill out paperwork to get insurance to cover a needed medication.   If a prior authorization is required to get your medication covered by your insurance company, please allow us 1-2 business days to complete this process.  Drug prices often vary depending on where the prescription is filled and some pharmacies may offer cheaper prices.  The website www.goodrx.com contains coupons for medications through different pharmacies. The prices here do not account for what the cost may be with help from insurance (it may be cheaper with your insurance), but the website can give you the price if you did not use any insurance.  - You can print the associated coupon and take it with   your prescription to the pharmacy.  - You may also stop by our office during regular business hours and pick up a GoodRx coupon card.  - If you need your prescription sent electronically to a different pharmacy, notify our office through [facility] MyChart or by phone at [phone] option 4.     Si Usted Necesita Algo Despus de Su Visita  Tambin puede enviarnos un mensaje a travs de MyChart. Por lo general respondemos a los mensajes de MyChart en el transcurso de 1 a 2  das hbiles.  Para renovar recetas, por favor pida a su farmacia que se ponga en contacto con nuestra oficina. Nuestro nmero de fax es el [phone].  Si tiene un asunto urgente cuando la clnica est cerrada y que no puede esperar hasta el siguiente da hbil, puede llamar/localizar a su doctor(a) al nmero que aparece a continuacin.   Por favor, tenga en cuenta que aunque hacemos todo lo posible para estar disponibles para asuntos urgentes fuera del horario de oficina, no estamos disponibles las 24 horas del da, los 7 das de la semana.   Si tiene un problema urgente y no puede comunicarse con nosotros, puede optar por buscar atencin mdica  en el consultorio de su doctor(a), en una clnica privada, en un centro de atencin urgente o en una sala de emergencias.  Si tiene una emergencia mdica, por favor llame inmediatamente al 911 o vaya a la sala de emergencias.  Nmeros de bper  - Dr. Kowalski: [phone]  - Dra. Moye: [phone]  - Dra. Stewart: [phone]  En caso de inclemencias del tiempo, por favor llame a nuestra lnea principal al [phone] para una actualizacin sobre el estado de cualquier retraso o cierre.  Consejos para la medicacin en dermatologa: Por favor, guarde las cajas en las que vienen los medicamentos de uso tpico para ayudarle a seguir las instrucciones sobre dnde y cmo usarlos. Las farmacias generalmente imprimen las instrucciones del medicamento slo en las cajas y no directamente en los tubos del medicamento.   Si su medicamento es muy caro, por favor, pngase en contacto con nuestra oficina llamando al [phone] y presione la opcin 4 o envenos un mensaje a travs de MyChart.   No podemos decirle cul ser su copago por los medicamentos por adelantado ya que esto es diferente dependiendo de la cobertura de su seguro. Sin embargo, es posible que podamos encontrar un medicamento sustituto a menor costo o llenar un formulario para que el  seguro cubra el medicamento que se considera necesario.   Si se requiere una autorizacin previa para que su compaa de seguros cubra su medicamento, por favor permtanos de 1 a 2 das hbiles para completar este proceso.  Los precios de los medicamentos varan con frecuencia dependiendo del lugar de dnde se surte la receta y alguna farmacias pueden ofrecer precios ms baratos.  El sitio web www.goodrx.com tiene cupones para medicamentos de diferentes farmacias. Los precios aqu no tienen en cuenta lo que podra costar con la ayuda del seguro (puede ser ms barato con su seguro), pero el sitio web puede darle el precio si no utiliz ningn seguro.  - Puede imprimir el cupn correspondiente y llevarlo con su receta a la farmacia.  - Tambin puede pasar por nuestra oficina durante el horario de atencin regular y recoger una tarjeta de cupones de GoodRx.  - Si necesita que su receta se enve electrnicamente a una farmacia diferente, informe a nuestra oficina a travs de MyChart de [place]   o por telfono llamando al [phone] y presione la opcin 4.  

## 2022-06-08 NOTE — Progress Notes (Signed)
   Follow-Up Visit   Subjective  John Baxter is a 71 y.o. male who presents for the following: Actinic Keratosis (Of the face and hands - patient has a lesion on his back that he would like checked today). The patient has spots, moles and lesions to be evaluated, some may be new or changing and the patient has concerns that these could be cancer.  The following portions of the chart were reviewed this encounter and updated as appropriate:   Tobacco  Allergies  Meds  Problems  Med Hx  Surg Hx  Fam Hx     Review of Systems:  No other skin or systemic complaints except as noted in HPI or Assessment and Plan.  Objective  Well appearing patient in no apparent distress; mood and affect are within normal limits.  A focused examination was performed including the face, arms, hands, and back. Relevant physical exam findings are noted in the Assessment and Plan.  Back x 2, R forearm x 1 (3) Erythematous stuck-on, waxy papule or plaque  Face x 7 (7) Erythematous thin papules/macules with gritty scale.    Assessment & Plan  Inflamed seborrheic keratosis (3) Back x 2, R forearm x 1  Destruction of lesion - Back x 2, R forearm x 1 Complexity: simple   Destruction method: cryotherapy   Informed consent: discussed and consent obtained   Timeout:  patient name, date of birth, surgical site, and procedure verified Lesion destroyed using liquid nitrogen: Yes   Region frozen until ice ball extended beyond lesion: Yes   Outcome: patient tolerated procedure well with no complications   Post-procedure details: wound care instructions given    AK (actinic keratosis) (7) Face x 7  Destruction of lesion - Face x 7 Complexity: simple   Destruction method: cryotherapy   Informed consent: discussed and consent obtained   Timeout:  patient name, date of birth, surgical site, and procedure verified Lesion destroyed using liquid nitrogen: Yes   Region frozen until ice ball extended beyond  lesion: Yes   Outcome: patient tolerated procedure well with no complications   Post-procedure details: wound care instructions given    Actinic Damage - chronic, secondary to cumulative UV radiation exposure/sun exposure over time - diffuse scaly erythematous macules with underlying dyspigmentation - Recommend daily broad spectrum sunscreen SPF 30+ to sun-exposed areas, reapply every 2 hours as needed.  - Recommend staying in the shade or wearing long sleeves, sun glasses (UVA+UVB protection) and wide brim hats (4-inch brim around the entire circumference of the hat). - Call for new or changing lesions.  Seborrheic Keratoses - Stuck-on, waxy, tan-brown papules and/or plaques  - Benign-appearing - Discussed benign etiology and prognosis. - Observe - Call for any changes  Lentigines - Scattered tan macules - Due to sun exposure - Benign-appearing, observe - Recommend daily broad spectrum sunscreen SPF 30+ to sun-exposed areas, reapply every 2 hours as needed. - Call for any changes  Return in about 6 months (around 12/08/2022) for AK and ISK follow up.  Luther Redo, CMA, am acting as scribe for Sarina Ser, MD . Documentation: I have reviewed the above documentation for accuracy and completeness, and I agree with the above.  Sarina Ser, MD

## 2022-06-10 DIAGNOSIS — I482 Chronic atrial fibrillation, unspecified: Secondary | ICD-10-CM | POA: Diagnosis not present

## 2022-06-14 ENCOUNTER — Encounter: Payer: Self-pay | Admitting: Dermatology

## 2022-06-15 DIAGNOSIS — Z86010 Personal history of colonic polyps: Secondary | ICD-10-CM | POA: Diagnosis not present

## 2022-06-30 ENCOUNTER — Ambulatory Visit: Payer: Medicare Other | Admitting: Family Medicine

## 2022-07-14 DIAGNOSIS — M545 Low back pain, unspecified: Secondary | ICD-10-CM | POA: Diagnosis not present

## 2022-07-14 DIAGNOSIS — Z79899 Other long term (current) drug therapy: Secondary | ICD-10-CM | POA: Diagnosis not present

## 2022-07-14 DIAGNOSIS — M0579 Rheumatoid arthritis with rheumatoid factor of multiple sites without organ or systems involvement: Secondary | ICD-10-CM | POA: Diagnosis not present

## 2022-09-16 ENCOUNTER — Telehealth: Payer: Self-pay | Admitting: Family Medicine

## 2022-09-16 ENCOUNTER — Encounter: Payer: Self-pay | Admitting: Gastroenterology

## 2022-09-16 DIAGNOSIS — E039 Hypothyroidism, unspecified: Secondary | ICD-10-CM

## 2022-09-16 MED ORDER — LEVOTHYROXINE SODIUM 100 MCG PO TABS: 100.0000 ug | ORAL_TABLET | Freq: Every day | ORAL | 0 refills | Status: DC

## 2022-09-16 NOTE — Telephone Encounter (Signed)
[facility] faxed refill request for the following medications:   levothyroxine (SYNTHROID) 100 MCG tablet    Please advise

## 2022-09-17 ENCOUNTER — Encounter: Payer: Self-pay | Admitting: Gastroenterology

## 2022-09-17 ENCOUNTER — Encounter
Admission: RE | Disposition: A | Discharge status: Home / Self Care | Payer: Self-pay | Source: Home / Self Care | Attending: Gastroenterology

## 2022-09-17 ENCOUNTER — Ambulatory Visit: Payer: Medicare Other | Admitting: Certified Registered"

## 2022-09-17 ENCOUNTER — Ambulatory Visit
Admission: RE | Admit: 2022-09-17 | Discharge: 2022-09-17 | Disposition: A | Discharge status: Home / Self Care | Payer: Medicare Other | Attending: Gastroenterology | Admitting: Gastroenterology

## 2022-09-17 DIAGNOSIS — M069 Rheumatoid arthritis, unspecified: Secondary | ICD-10-CM | POA: Insufficient documentation

## 2022-09-17 DIAGNOSIS — Z800 Family history of malignant neoplasm of digestive organs: Secondary | ICD-10-CM | POA: Insufficient documentation

## 2022-09-17 DIAGNOSIS — Z79899 Other long term (current) drug therapy: Secondary | ICD-10-CM | POA: Diagnosis not present

## 2022-09-17 DIAGNOSIS — K644 Residual hemorrhoidal skin tags: Secondary | ICD-10-CM | POA: Insufficient documentation

## 2022-09-17 DIAGNOSIS — D122 Benign neoplasm of ascending colon: Secondary | ICD-10-CM | POA: Diagnosis not present

## 2022-09-17 DIAGNOSIS — K640 First degree hemorrhoids: Secondary | ICD-10-CM | POA: Insufficient documentation

## 2022-09-17 DIAGNOSIS — Z1211 Encounter for screening for malignant neoplasm of colon: Secondary | ICD-10-CM | POA: Insufficient documentation

## 2022-09-17 DIAGNOSIS — Z83719 Family history of colon polyps, unspecified: Secondary | ICD-10-CM | POA: Insufficient documentation

## 2022-09-17 DIAGNOSIS — Z79620 Long term (current) use of immunosuppressive biologic: Secondary | ICD-10-CM | POA: Insufficient documentation

## 2022-09-17 DIAGNOSIS — D124 Benign neoplasm of descending colon: Secondary | ICD-10-CM | POA: Insufficient documentation

## 2022-09-17 DIAGNOSIS — E039 Hypothyroidism, unspecified: Secondary | ICD-10-CM | POA: Diagnosis not present

## 2022-09-17 DIAGNOSIS — Z79631 Long term (current) use of antimetabolite agent: Secondary | ICD-10-CM | POA: Diagnosis not present

## 2022-09-17 DIAGNOSIS — I10 Essential (primary) hypertension: Secondary | ICD-10-CM | POA: Insufficient documentation

## 2022-09-17 DIAGNOSIS — K635 Polyp of colon: Secondary | ICD-10-CM | POA: Diagnosis not present

## 2022-09-17 DIAGNOSIS — G4730 Sleep apnea, unspecified: Secondary | ICD-10-CM | POA: Insufficient documentation

## 2022-09-17 DIAGNOSIS — Z86010 Personal history of colonic polyps: Secondary | ICD-10-CM | POA: Diagnosis not present

## 2022-09-17 DIAGNOSIS — D126 Benign neoplasm of colon, unspecified: Secondary | ICD-10-CM | POA: Diagnosis not present

## 2022-09-17 HISTORY — DX: Unspecified atrial fibrillation: I48.91

## 2022-09-17 HISTORY — PX: COLONOSCOPY: SHX5424

## 2022-09-17 SURGERY — COLONOSCOPY
Anesthesia: General

## 2022-09-17 MED ORDER — PROPOFOL 10 MG/ML IV BOLUS
INTRAVENOUS | Status: DC | PRN
  Administered 2022-09-17: 70 mg via INTRAVENOUS
  Administered 2022-09-17: 30 mg via INTRAVENOUS
  Administered 2022-09-17: 20 mg via INTRAVENOUS

## 2022-09-17 MED ORDER — PHENYLEPHRINE 80 MCG/ML (10ML) SYRINGE FOR IV PUSH (FOR BLOOD PRESSURE SUPPORT)
PREFILLED_SYRINGE | INTRAVENOUS | Status: DC | PRN
  Administered 2022-09-17: 160 ug via INTRAVENOUS

## 2022-09-17 MED ORDER — SODIUM CHLORIDE 0.9 % IV SOLN: INTRAVENOUS | Status: DC

## 2022-09-17 MED ORDER — LIDOCAINE HCL (CARDIAC) PF 100 MG/5ML IV SOSY
PREFILLED_SYRINGE | INTRAVENOUS | Status: DC | PRN
  Administered 2022-09-17: 100 mg via INTRAVENOUS

## 2022-09-17 MED ORDER — METOPROLOL TARTRATE 5 MG/5ML IV SOLN
INTRAVENOUS | Status: DC | PRN
  Administered 2022-09-17 (×2): 2 mg via INTRAVENOUS

## 2022-09-17 MED ORDER — ESMOLOL HCL 100 MG/10ML IV SOLN
INTRAVENOUS | Status: DC | PRN
  Administered 2022-09-17 (×2): 20 mg via INTRAVENOUS

## 2022-09-17 MED ORDER — PROPOFOL 500 MG/50ML IV EMUL
INTRAVENOUS | Status: DC | PRN
  Administered 2022-09-17: 165 ug/kg/min via INTRAVENOUS

## 2022-09-17 NOTE — Interval H&P Note (Signed)
History and Physical Interval Note: Preprocedure H&P from 09/17/22  was reviewed and there was no interval change after seeing and examining the patient.  Written consent was obtained from the patient after discussion of risks, benefits, and alternatives. Patient has consented to proceed with Colonoscopy with possible intervention   09/17/2022 10:36 AM  John Baxter  has presented today for surgery, with the diagnosis of Personal history of colonic polyps (Z86.010).  The various methods of treatment have been discussed with the patient and family. After consideration of risks, benefits and other options for treatment, the patient has consented to  Procedure(s): COLONOSCOPY (N/A) as a surgical intervention.  The patient's history has been reviewed, patient examined, no change in status, stable for surgery.  I have reviewed the patient's chart and labs.  Questions were answered to the patient's satisfaction.     Annamaria Helling

## 2022-09-17 NOTE — Anesthesia Postprocedure Evaluation (Signed)
Anesthesia Post Note  Patient: John Baxter  Procedure(s) Performed: COLONOSCOPY  Patient location during evaluation: Endoscopy Anesthesia Type: General Level of consciousness: awake and alert Pain management: pain level controlled Vital Signs Assessment: post-procedure vital signs reviewed and stable Respiratory status: spontaneous breathing, nonlabored ventilation, respiratory function stable and patient connected to nasal cannula oxygen Cardiovascular status: blood pressure returned to baseline and stable Postop Assessment: no apparent nausea or vomiting Anesthetic complications: no   No notable events documented.   Last Vitals:  Vitals:   09/17/22 1121 09/17/22 1131  BP: 102/61 103/70  Pulse: 100 (!) 105  Resp: (!) 25 18  Temp:    SpO2: 99% 99%    Last Pain:  Vitals:   09/17/22 1131  TempSrc:   PainSc: 0-No pain                 Arita Miss

## 2022-09-17 NOTE — Transfer of Care (Signed)
Immediate Anesthesia Transfer of Care Note  Patient: John Baxter  Procedure(s) Performed: COLONOSCOPY  Patient Location: Endoscopy Unit  Anesthesia Type:General  Level of Consciousness: drowsy and patient cooperative  Airway & Oxygen Therapy: Patient Spontanous Breathing and Patient connected to face mask oxygen  Post-op Assessment: Report given to RN and Post -op Vital signs reviewed and stable  Post vital signs: Reviewed and stable  Last Vitals:  Vitals Value Taken Time  BP 92/71 09/17/22 1111  Temp 35.9 C 09/17/22 1111  Pulse 122 09/17/22 1111  Resp 15 09/17/22 1111  SpO2 99 % 09/17/22 1111    Last Pain:  Vitals:   09/17/22 1111  TempSrc: Temporal  PainSc: Asleep         Complications: No notable events documented.

## 2022-09-17 NOTE — Op Note (Signed)
[facility] Gastroenterology Patient Name: John Baxter Procedure Date: 09/17/2022 10:27 AM MRN: [id number] Account #: [id number] Date of Birth: [birth year] Admit Type: Outpatient Age: 72 Room: [facility] ENDO ROOM 1 Gender: Male Note Status: Finalized Instrument Name: Colonoscope [id number] Procedure:             Colonoscopy Indications:           High risk colon cancer surveillance: Personal history                         of colonic polyps Providers:             Rueben Bash, DO Referring MD:          Janine Ores. Rosanna Randy, MD (Referring MD) Medicines:             Monitored Anesthesia Care Complications:         No immediate complications. Estimated blood loss:                         Minimal. Procedure:             Pre-Anesthesia Assessment:                        - Prior to the procedure, a History and Physical was                         performed, and patient medications and allergies were                         reviewed. The patient is competent. The risks and                         benefits of the procedure and the sedation options and                         risks were discussed with the patient. All questions                         were answered and informed consent was obtained.                         Patient identification and proposed procedure were                         verified by the physician, the nurse, the anesthetist                         and the technician in the endoscopy suite. Mental                         Status Examination: alert and oriented. Airway                         Examination: normal oropharyngeal airway and neck                         mobility. Respiratory Examination: clear to  auscultation. CV Examination: RRR, no murmurs, no S3                         or S4. Prophylactic Antibiotics: The patient does not                         require prophylactic antibiotics. Prior                          Anticoagulants: The patient has taken no anticoagulant                         or antiplatelet agents. ASA Grade Assessment: II - A                         patient with mild systemic disease. After reviewing                         the risks and benefits, the patient was deemed in                         satisfactory condition to undergo the procedure. The                         anesthesia plan was to use monitored anesthesia care                         (MAC). Immediately prior to administration of                         medications, the patient was re-assessed for adequacy                         to receive sedatives. The heart rate, respiratory                         rate, oxygen saturations, blood pressure, adequacy of                         pulmonary ventilation, and response to care were                         monitored throughout the procedure. The physical                         status of the patient was re-assessed after the                         procedure.                        After obtaining informed consent, the colonoscope was                         passed under direct vision. Throughout the procedure,                         the patient's blood pressure, pulse, and oxygen  saturations were monitored continuously. The                         Colonoscope was introduced through the anus and                         advanced to the the cecum, identified by appendiceal                         orifice and ileocecal valve. The colonoscopy was                         performed without difficulty. The patient tolerated                         the procedure well. The quality of the bowel                         preparation was evaluated using the BBPS [facility] Bowel                         Preparation Scale) with scores of: Right Colon = 2                         (minor amount of residual staining, small fragments of                         stool and/or  opaque liquid, but mucosa seen well),                         Transverse Colon = 3 (entire mucosa seen well with no                         residual staining, small fragments of stool or opaque                         liquid) and Left Colon = 3 (entire mucosa seen well                         with no residual staining, small fragments of stool or                         opaque liquid). The total BBPS score equals 8. The                         quality of the bowel preparation was excellent. The                         ileocecal valve, appendiceal orifice, and rectum were                         photographed. Findings:      Skin tags were found on perianal exam.      The digital rectal exam was normal. Pertinent negatives include normal       sphincter tone.      Non-bleeding internal hemorrhoids were found during retroflexion. The  hemorrhoids were Grade I (internal hemorrhoids that do not prolapse).       Estimated blood loss: none.      Two sessile polyps were found in the descending colon and ascending       colon. The polyps were 3 to 4 mm in size. These polyps were removed with       a cold snare. Resection and retrieval were complete. Estimated blood       loss was minimal.      The exam was otherwise without abnormality on direct and retroflexion       views.      Retroflexion in the right colon was performed. Impression:            - Perianal skin tags found on perianal exam.                        - Non-bleeding internal hemorrhoids.                        - Two 3 to 4 mm polyps in the descending colon and in                         the ascending colon, removed with a cold snare.                         Resected and retrieved.                        - The examination was otherwise normal on direct and                         retroflexion views. Recommendation:        - Patient has a contact number available for                         emergencies. The signs and symptoms  of potential                         delayed complications were discussed with the patient.                         Return to normal activities tomorrow. Written                         discharge instructions were provided to the patient.                        - Discharge patient to home.                        - Resume previous diet.                        - Continue present medications.                        - No aspirin, ibuprofen, naproxen, or other                         non-steroidal anti-inflammatory drugs for 5 days after  polyp removal.                        - Await pathology results.                        - Repeat colonoscopy for surveillance based on                         pathology results.                        - Return to referring physician as previously                         scheduled.                        - The findings and recommendations were discussed with                         the patient. Procedure Code(s):     --- Professional ---                        [phone], Colonoscopy, flexible; with removal of                         tumor(s), polyp(s), or other lesion(s) by snare                         technique Diagnosis Code(s):     --- Professional ---                        Z86.010, Personal history of colonic polyps                        D12.4, Benign neoplasm of descending colon                        D12.2, Benign neoplasm of ascending colon                        K64.0, First degree hemorrhoids                        K64.4, Residual hemorrhoidal skin tags CPT copyright 2022 American [employer] Association. All rights reserved. The codes documented in this report are preliminary and upon coder review may  be revised to meet current compliance requirements. Attending Participation:      I personally performed the entire procedure. Volney American, DO Annamaria Helling DO, DO 09/17/2022 11:14:50 AM This report has been signed  electronically. Number of Addenda: 0 Note Initiated On: 09/17/2022 10:27 AM Scope Withdrawal Time: 0 hours 16 minutes 17 seconds  Total Procedure Duration: 0 hours 23 minutes 11 seconds  Estimated Blood Loss:  Estimated blood loss was minimal.      [facility]

## 2022-09-17 NOTE — H&P (Signed)
Pre-Procedure H&P   Patient ID: John Baxter is a 72 y.o. male.  Gastroenterology Provider: Annamaria Helling, DO  Referring Provider: Laurine Blazer, PA PCP: Eulas Post, MD  Date: 09/17/2022  HPI John Baxter is a 72 y.o. male who presents today for Colonoscopy for phx colon polyps; fhx colon cancer .  Patient undergoing surveillance for personal history of colon polyps.  His last colonoscopy was in March 2017 noting a tubular adenoma.  He also had polyps in 2010.  Family history of colon cancer in his father (74). Sisters with polyps  Most recent lab work hemoglobin 14.6 MCV 98 platelets 213,000 creatinine 1.0.  He is on methotrexate and Humira for rheumatoid arthritis.   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 lat. neck. Hypertrophic.   Atrial fibrillation ([place])    Hypertension     Past Surgical History:  Procedure Laterality Date   COLONOSCOPY     CORNEAL TRANSPLANT     TOE SURGERY     Ingrown nail x2    Family History Father- crc; sister- polyps No h/o GI disease or malignancy  Review of Systems  Constitutional:  Negative for activity change, appetite change, chills, diaphoresis, fatigue, fever and unexpected weight change.  HENT:  Negative for trouble swallowing and voice change.   Respiratory:  Negative for shortness of breath and wheezing.   Cardiovascular:  Negative for chest pain, palpitations and leg swelling.  Gastrointestinal:  Negative for abdominal distention, abdominal pain, anal bleeding, blood in stool, constipation, diarrhea, nausea and vomiting.  Musculoskeletal:  Negative for arthralgias and myalgias.  Skin:  Negative for color change and pallor.  Neurological:  Negative for dizziness, syncope and weakness.  Psychiatric/Behavioral:  Negative for confusion. The patient is not nervous/anxious.   All other systems reviewed and are negative.    Medications No current  facility-administered medications on file prior to encounter.   Current Outpatient Medications on File Prior to Encounter  Medication Sig Dispense Refill   aspirin 81 MG chewable tablet Chew 81 mg by mouth daily.      folic acid (FOLVITE) 1 MG tablet Take 1 mg by mouth daily.  (Patient not taking: Reported on 09/17/2022)     Ganciclovir (ZIRGAN) 0.15 % GEL Apply to eye.     HUMIRA PEN 40 MG/0.4ML PNKT Inject 1 pen/syringe subcutaneously every other week     methotrexate 2.5 MG tablet Take 10 mg by mouth. Takes 4 tablets once a week     prednisoLONE acetate (PRED FORTE) 1 % ophthalmic suspension INSTILL 1 DROP IN THE LEFT EYE DAILY     prednisoLONE acetate (PRED FORTE) 1 % ophthalmic suspension      RESTASIS 0.05 % ophthalmic emulsion Place 1 drop into the right eye 3 (three) times daily.      valACYclovir (VALTREX) 1000 MG tablet Take 1,000 mg by mouth 3 (three) times daily.     ZIRGAN 0.15 % GEL       Pertinent medications related to GI and procedure were reviewed by me with the patient prior to the procedure   Current Facility-Administered Medications:    0.9 %  sodium chloride infusion, , Intravenous, Continuous, Annamaria Helling, DO, Last Rate: 20 mL/hr at 09/17/22 0934, New Bag at 09/17/22 0934  sodium chloride 20 mL/hr at 09/17/22 0934       No Known Allergies Allergies were reviewed by me prior to the procedure  Objective  Body mass index is 30.36 kg/m. Vitals:   09/17/22 0911  BP: 118/84  Pulse: 62  Resp: 17  Temp: 97.7 F (36.5 C)  TempSrc: Temporal  SpO2: 100%  Weight: 96 kg  Height: '5\' 10"'$  (1.778 m)     Physical Exam Vitals and nursing note reviewed.  Constitutional:      General: He is not in acute distress.    Appearance: Normal appearance. He is not ill-appearing, toxic-appearing or diaphoretic.  HENT:     Head: Normocephalic and atraumatic.     Nose: Nose normal.     Mouth/Throat:     Mouth: Mucous membranes are moist.     Pharynx: Oropharynx  is clear.  Eyes:     General: No scleral icterus.    Extraocular Movements: Extraocular movements intact.  Cardiovascular:     Rate and Rhythm: Normal rate and regular rhythm.     Heart sounds: Normal heart sounds. No murmur heard.    No friction rub. No gallop.  Pulmonary:     Effort: Pulmonary effort is normal. No respiratory distress.     Breath sounds: Normal breath sounds. No wheezing, rhonchi or rales.  Abdominal:     General: Bowel sounds are normal. There is no distension.     Palpations: Abdomen is soft.     Tenderness: There is no abdominal tenderness. There is no guarding or rebound.  Musculoskeletal:     Cervical back: Neck supple.     Right lower leg: No edema.     Left lower leg: No edema.  Skin:    General: Skin is warm and dry.     Coloration: Skin is not jaundiced or pale.  Neurological:     General: No focal deficit present.     Mental Status: He is alert and oriented to person, place, and time. Mental status is at baseline.  Psychiatric:        Mood and Affect: Mood normal.        Behavior: Behavior normal.        Thought Content: Thought content normal.        Judgment: Judgment normal.      Assessment:  John Baxter is a 72 y.o. male  who presents today for Colonoscopy for phx colon polyps; fhx colon cancer .  Plan:  Colonoscopy with possible intervention today  Colonoscopy with possible biopsy, control of bleeding, polypectomy, and interventions as necessary has been discussed with the patient/patient representative. Informed consent was obtained from the patient/patient representative after explaining the indication, nature, and risks of the procedure including but not limited to death, bleeding, perforation, missed neoplasm/lesions, cardiorespiratory compromise, and reaction to medications. Opportunity for questions was given and appropriate answers were provided. Patient/patient representative has verbalized understanding is amenable to  undergoing the procedure.   Annamaria Helling, DO  [facility] Gastroenterology  Portions of the record may have been created with voice recognition software. Occasional wrong-word or 'sound-a-like' substitutions may have occurred due to the inherent limitations of voice recognition software.  Read the chart carefully and recognize, using context, where substitutions may have occurred.

## 2022-09-17 NOTE — Anesthesia Procedure Notes (Signed)
Procedure Name: General with mask airway Date/Time: 09/17/2022 11:07 AM  Performed by: Kelton Pillar, CRNAPre-anesthesia Checklist: Patient identified, Emergency Drugs available, Suction available and Patient being monitored Patient Re-evaluated:Patient Re-evaluated prior to induction Oxygen Delivery Method: Simple face mask Induction Type: IV induction Placement Confirmation: positive ETCO2, CO2 detector and breath sounds checked- equal and bilateral Dental Injury: Teeth and Oropharynx as per pre-operative assessment

## 2022-09-17 NOTE — Anesthesia Preprocedure Evaluation (Signed)
Anesthesia Evaluation  Patient identified by MRN, date of birth, ID band Patient awake    Reviewed: Allergy & Precautions, NPO status , Patient's Chart, lab work & pertinent test results  History of Anesthesia Complications Negative for: history of anesthetic complications  Airway Mallampati: II  TM Distance: >3 FB Neck ROM: Full    Dental no notable dental hx. (+) Teeth Intact   Pulmonary sleep apnea and Continuous Positive Airway Pressure Ventilation , neg COPD, Patient abstained from smoking.Not current smoker   Pulmonary exam normal breath sounds clear to auscultation       Cardiovascular Exercise Tolerance: Good METShypertension, Pt. on medications (-) CAD and (-) Past MI + dysrhythmias Atrial Fibrillation  Rhythm:Regular Rate:Normal - Systolic murmurs    Neuro/Psych negative neurological ROS  negative psych ROS   GI/Hepatic ,neg GERD  ,,(+)     (-) substance abuse    Endo/Other  neg diabetesHypothyroidism    Renal/GU negative Renal ROS     Musculoskeletal   Abdominal   Peds  Hematology   Anesthesia Other Findings Past Medical History: 01/02/2015: Actinic keratosis     Comment:  Right post. lat. neck. Hypertrophic. No date: Atrial fibrillation ([facility]) No date: Hypertension  Reproductive/Obstetrics                              Anesthesia Physical Anesthesia Plan  ASA: 2  Anesthesia Plan: General   Post-op Pain Management: Minimal or no pain anticipated   Induction: Intravenous  PONV Risk Score and Plan: 2 and Propofol infusion, TIVA and Ondansetron  Airway Management Planned: Nasal Cannula  Additional Equipment: None  Intra-op Plan:   Post-operative Plan:   Informed Consent: I have reviewed the patients History and Physical, chart, labs and discussed the procedure including the risks, benefits and alternatives for the proposed anesthesia with the patient or  authorized representative who has indicated his/her understanding and acceptance.     Dental advisory given  Plan Discussed with: CRNA and Surgeon  Anesthesia Plan Comments: (Discussed risks of anesthesia with patient, including possibility of difficulty with spontaneous ventilation under anesthesia necessitating airway intervention, PONV, and rare risks such as cardiac or respiratory or neurological events, and allergic reactions. Discussed the role of CRNA in patient's perioperative care. Patient understands.)         Anesthesia Quick Evaluation

## 2022-09-18 ENCOUNTER — Encounter: Payer: Self-pay | Admitting: Gastroenterology

## 2022-09-18 LAB — SURGICAL PATHOLOGY

## 2022-09-30 DIAGNOSIS — H179 Unspecified corneal scar and opacity: Secondary | ICD-10-CM | POA: Diagnosis not present

## 2022-09-30 DIAGNOSIS — H04129 Dry eye syndrome of unspecified lacrimal gland: Secondary | ICD-10-CM | POA: Diagnosis not present

## 2022-10-12 DIAGNOSIS — H25091 Other age-related incipient cataract, right eye: Secondary | ICD-10-CM | POA: Diagnosis not present

## 2022-11-02 DIAGNOSIS — H04129 Dry eye syndrome of unspecified lacrimal gland: Secondary | ICD-10-CM | POA: Diagnosis not present

## 2022-11-10 ENCOUNTER — Ambulatory Visit (INDEPENDENT_AMBULATORY_CARE_PROVIDER_SITE_OTHER): Payer: Medicare Other | Admitting: Physician Assistant

## 2022-11-10 VITALS — BP 120/77 | HR 88 | Ht 71.0 in | Wt 218.4 lb

## 2022-11-10 DIAGNOSIS — R051 Acute cough: Secondary | ICD-10-CM | POA: Diagnosis not present

## 2022-11-10 DIAGNOSIS — J040 Acute laryngitis: Secondary | ICD-10-CM | POA: Diagnosis not present

## 2022-11-10 LAB — POC COVID19 BINAXNOW: SARS Coronavirus 2 Ag: NEGATIVE

## 2022-11-10 MED ORDER — PREDNISONE 10 MG PO TABS: ORAL_TABLET | ORAL | 0 refills | Status: AC

## 2022-11-10 MED ORDER — HYDROCOD POLI-CHLORPHE POLI ER 10-8 MG/5ML PO SUER: 5.0000 mL | Freq: Every evening | ORAL | 0 refills | Status: DC | PRN

## 2022-11-10 NOTE — Progress Notes (Signed)
I,Sha'taria Tyson,acting as a Education administrator for [employer], PA-C.,have documented all relevant documentation on the behalf of Mikey Kirschner, PA-C,as directed by  Mikey Kirschner, PA-C while in the presence of Mikey Kirschner, PA-C.   Established patient visit   Patient: John Baxter   DOB: Jul 30, 1951   72 y.o. Male  MRN: [id number] Visit Date: 11/10/2022  Today's healthcare provider: Mikey Kirschner, PA-C   Cc. Cough x 3 days   Subjective    HPI  Pt reports dry cough x 3 days, hoarse voice. Denies nasal congestion, headache, fever, shortness of breath, wheezing. Reports using his voice yesterday quite a bit which irritated it.   Denies taking anything OTC  Medications: Outpatient Medications Prior to Visit  Medication Sig   aspirin 81 MG chewable tablet Chew 81 mg by mouth daily.    Ganciclovir (ZIRGAN) 0.15 % GEL Apply to eye.   HUMIRA PEN 40 MG/0.4ML PNKT Inject 1 pen/syringe subcutaneously every other week   levothyroxine (SYNTHROID) 100 MCG tablet Take 1 tablet (100 mcg total) by mouth daily. Please schedule office visit before any future refill.   methotrexate 2.5 MG tablet Take 10 mg by mouth. Takes 4 tablets once a week   prednisoLONE acetate (PRED FORTE) 1 % ophthalmic suspension    RESTASIS 0.05 % ophthalmic emulsion Place 1 drop into the right eye 3 (three) times daily.    valACYclovir (VALTREX) 1000 MG tablet Take 1,000 mg by mouth 3 (three) times daily.   ZIRGAN 0.15 % GEL    [DISCONTINUED] folic acid (FOLVITE) 1 MG tablet Take 1 mg by mouth daily.  (Patient not taking: Reported on 09/17/2022)   [DISCONTINUED] prednisoLONE acetate (PRED FORTE) 1 % ophthalmic suspension INSTILL 1 DROP IN THE LEFT EYE DAILY   No facility-administered medications prior to visit.    Review of Systems  Constitutional:  Negative for fatigue and fever.  HENT:  Positive for voice change.   Respiratory:  Positive for cough. Negative for shortness of breath.   Cardiovascular:  Negative  for chest pain, palpitations and leg swelling.  Neurological:  Negative for dizziness and headaches.     Objective    BP 120/77   Pulse 88   Ht 5\' 11"  (1.803 m)   Wt 218 lb 6.4 oz (99.1 kg)   BMI 30.46 kg/m    Physical Exam Constitutional:      General: He is awake.     Appearance: He is well-developed.  HENT:     Head: Normocephalic.     Mouth/Throat:     Pharynx: Posterior oropharyngeal erythema present. No oropharyngeal exudate.  Eyes:     Conjunctiva/sclera: Conjunctivae normal.  Cardiovascular:     Rate and Rhythm: Normal rate and regular rhythm.     Heart sounds: Normal heart sounds.  Pulmonary:     Effort: Pulmonary effort is normal.     Breath sounds: Normal breath sounds.  Skin:    General: Skin is warm.  Neurological:     Mental Status: He is alert and oriented to person, place, and time.  Psychiatric:        Attention and Perception: Attention normal.        Mood and Affect: Mood normal.        Speech: Speech normal.        Behavior: Behavior is cooperative.     No results found for any visits on 11/10/22.  Assessment & Plan     Laryngitis 2. Acute cough Recommending supportive  tx -- steam showers, hot liquids, honey, cough drops. 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      I, Mikey Kirschner, PA-C have reviewed all documentation for this visit. The documentation on  11/10/22  for the exam, diagnosis, procedures, and orders are all accurate and complete.  Mikey Kirschner, PA-C [facility] [address] [place], [place], [address] Office: [phone] Fax: [place]

## 2022-11-12 DIAGNOSIS — M0579 Rheumatoid arthritis with rheumatoid factor of multiple sites without organ or systems involvement: Secondary | ICD-10-CM | POA: Diagnosis not present

## 2022-11-12 DIAGNOSIS — Z79899 Other long term (current) drug therapy: Secondary | ICD-10-CM | POA: Diagnosis not present

## 2022-11-17 DIAGNOSIS — H52221 Regular astigmatism, right eye: Secondary | ICD-10-CM | POA: Diagnosis not present

## 2022-11-17 DIAGNOSIS — H25811 Combined forms of age-related cataract, right eye: Secondary | ICD-10-CM | POA: Diagnosis not present

## 2022-11-26 DIAGNOSIS — G4733 Obstructive sleep apnea (adult) (pediatric): Secondary | ICD-10-CM | POA: Diagnosis not present

## 2022-11-26 DIAGNOSIS — I482 Chronic atrial fibrillation, unspecified: Secondary | ICD-10-CM | POA: Diagnosis not present

## 2022-11-26 DIAGNOSIS — I071 Rheumatic tricuspid insufficiency: Secondary | ICD-10-CM | POA: Diagnosis not present

## 2022-11-26 DIAGNOSIS — I428 Other cardiomyopathies: Secondary | ICD-10-CM | POA: Diagnosis not present

## 2022-11-26 DIAGNOSIS — E782 Mixed hyperlipidemia: Secondary | ICD-10-CM | POA: Diagnosis not present

## 2022-11-26 DIAGNOSIS — I340 Nonrheumatic mitral (valve) insufficiency: Secondary | ICD-10-CM | POA: Diagnosis not present

## 2022-11-28 DIAGNOSIS — G4733 Obstructive sleep apnea (adult) (pediatric): Secondary | ICD-10-CM | POA: Diagnosis not present

## 2022-12-10 ENCOUNTER — Ambulatory Visit: Payer: Medicare Other | Admitting: Dermatology

## 2022-12-10 ENCOUNTER — Encounter: Payer: Self-pay | Admitting: Dermatology

## 2022-12-10 VITALS — BP 131/74 | HR 74

## 2022-12-10 DIAGNOSIS — L814 Other melanin hyperpigmentation: Secondary | ICD-10-CM

## 2022-12-10 DIAGNOSIS — Z79899 Other long term (current) drug therapy: Secondary | ICD-10-CM

## 2022-12-10 DIAGNOSIS — L570 Actinic keratosis: Secondary | ICD-10-CM

## 2022-12-10 DIAGNOSIS — L739 Follicular disorder, unspecified: Secondary | ICD-10-CM | POA: Diagnosis not present

## 2022-12-10 DIAGNOSIS — D692 Other nonthrombocytopenic purpura: Secondary | ICD-10-CM

## 2022-12-10 DIAGNOSIS — L821 Other seborrheic keratosis: Secondary | ICD-10-CM

## 2022-12-10 DIAGNOSIS — L578 Other skin changes due to chronic exposure to nonionizing radiation: Secondary | ICD-10-CM

## 2022-12-10 MED ORDER — CLINDAMYCIN PHOSPHATE 1 % EX SOLN: CUTANEOUS | 11 refills | Status: AC

## 2022-12-10 NOTE — Progress Notes (Signed)
Follow-Up Visit   Subjective  John Baxter is a 72 y.o. male who presents for the following: 6 month ak and isk follow up The patient has spots, moles and lesions to be evaluated, some may be new or changing and the patient may have concern these could be cancer.  The following portions of the chart were reviewed this encounter and updated as appropriate: medications, allergies, medical history  Review of Systems:  No other skin or systemic complaints except as noted in HPI or Assessment and Plan.  Objective  Well appearing patient in no apparent distress; mood and affect are within normal limits. .  A focused examination was performed of the following areas: Face, b/l arms, neck   Relevant exam findings are noted in the Assessment and Plan.  b/l hands x 2, face and ears x 2 (4) Erythematous thin papules/macules with gritty scale.    Assessment & Plan   SEBORRHEIC KERATOSIS - Stuck-on, waxy, tan-brown papules and/or plaques  - Benign-appearing - Discussed benign etiology and prognosis. - Observe - Call for any changes  Purpura - Chronic; persistent and recurrent.  Treatable, but not curable. - Violaceous macules and patches - Benign - Related to trauma, age, sun damage and/or use of blood thinners, chronic use of topical and/or oral steroids - Observe - Can use OTC arnica containing moisturizer such as Dermend Bruise Formula if desired - Call for worsening or other concerns  LENTIGINES Exam: scattered tan macules Due to sun exposure Treatment Plan: Benign-appearing, observe. Recommend daily broad spectrum sunscreen SPF 30+ to sun-exposed areas, reapply every 2 hours as needed.  Call for any changes  ACTINIC DAMAGE - chronic, secondary to cumulative UV radiation exposure/sun exposure over time - diffuse scaly erythematous macules with underlying dyspigmentation - Recommend daily broad spectrum sunscreen SPF 30+ to sun-exposed areas, reapply every 2 hours as  needed.  - Recommend staying in the shade or wearing long sleeves, sun glasses (UVA+UVB protection) and wide brim hats (4-inch brim around the entire circumference of the hat). - Call for new or changing lesions.  Actinic keratosis (4) b/l hands x 2, face and ears x 2  Actinic keratoses are precancerous spots that appear secondary to cumulative UV radiation exposure/sun exposure over time. They are chronic with expected duration over 1 year. A portion of actinic keratoses will progress to squamous cell carcinoma of the skin. It is not possible to reliably predict which spots will progress to skin cancer and so treatment is recommended to prevent development of skin cancer.  Recommend daily broad spectrum sunscreen SPF 30+ to sun-exposed areas, reapply every 2 hours as needed.  Recommend staying in the shade or wearing long sleeves, sun glasses (UVA+UVB protection) and wide brim hats (4-inch brim around the entire circumference of the hat). Call for new or changing lesions.  Destruction of lesion - b/l hands x 2, face and ears x 2 Complexity: simple   Destruction method: cryotherapy   Informed consent: discussed and consent obtained   Timeout:  patient name, date of birth, surgical site, and procedure verified Lesion destroyed using liquid nitrogen: Yes   Region frozen until ice ball extended beyond lesion: Yes   Outcome: patient tolerated procedure well with no complications   Post-procedure details: wound care instructions given    FOLLICULITIS Exam: Perifollicular erythematous papules and pustules Treatment Plan: Start clindamycin 1 % lotion apply topically to aa's qd /bid.  Folliculitis Related Medications clindamycin (CLEOCIN T) 1 % external solution Apply to affected  areas of breakouts back of neck / hairline use qd/bid Return in about 6 months (around 06/11/2023) for TBSE.  IAsher Muir, CMA, am acting as scribe for Armida Sans, MD.  Documentation: I have reviewed the  above documentation for accuracy and completeness, and I agree with the above.  Armida Sans, MD

## 2022-12-10 NOTE — Patient Instructions (Addendum)
Seborrheic Keratosis  What causes seborrheic keratoses? Seborrheic keratoses are harmless, common skin growths that first appear during adult life.  As time goes by, more growths appear.  Some people may develop a large number of them.  Seborrheic keratoses appear on both covered and uncovered body parts.  They are not caused by sunlight.  The tendency to develop seborrheic keratoses can be inherited.  They vary in color from skin-colored to gray, brown, or even black.  They can be either smooth or have a rough, warty surface.   Seborrheic keratoses are superficial and look as if they were stuck on the skin.  Under the microscope this type of keratosis looks like layers upon layers of skin.  That is why at times the top layer may seem to fall off, but the rest of the growth remains and re-grows.    Treatment Seborrheic keratoses do not need to be treated, but can easily be removed in the office.  Seborrheic keratoses often cause symptoms when they rub on clothing or jewelry.  Lesions can be in the way of shaving.  If they become inflamed, they can cause itching, soreness, or burning.  Removal of a seborrheic keratosis can be accomplished by freezing, burning, or surgery. If any spot bleeds, scabs, or grows rapidly, please return to have it checked, as these can be an indication of a skin cancer.   Cryotherapy Aftercare  Wash gently with soap and water everyday.   Apply Vaseline and Band-Aid daily until healed.    Actinic keratoses are precancerous spots that appear secondary to cumulative UV radiation exposure/sun exposure over time. They are chronic with expected duration over 1 year. A portion of actinic keratoses will progress to squamous cell carcinoma of the skin. It is not possible to reliably predict which spots will progress to skin cancer and so treatment is recommended to prevent development of skin cancer.  Recommend daily broad spectrum sunscreen SPF 30+ to sun-exposed areas, reapply  every 2 hours as needed.  Recommend staying in the shade or wearing long sleeves, sun glasses (UVA+UVB protection) and wide brim hats (4-inch brim around the entire circumference of the hat). Call for new or changing lesions.      Start clindamycin solution - apply topically daily to twice daily for breakouts at neck and hairline  Folliculitis  Folliculitis occurs when hair follicles become inflamed. A hair follicle is a tiny opening in your skin where your hair grows from. This condition often occurs on the scalp, thighs, legs, back, and buttocks but can happen anywhere on the body. What are the causes? A common cause of this condition is an infection from bacteria. The type of folliculitis caused by bacteria can last a long time or go away and come back. The bacteria can live anywhere on your skin. They are often found in the nostrils. Other causes may include: An infection from a fungus. An infection from a virus. Your skin touching some chemicals, such as oils and tars. Shaving or waxing. Greasy ointments or creams put on the skin. What increases the risk? You are more likely to develop this condition if: Your body has a weak disease-fighting system (immune system). You have diabetes. You are obese. What are the signs or symptoms? Symptoms of this condition include: Redness. Soreness. Swelling. Itching. Small white or yellow, itchy spots filled with pus (pustules) that appear over a red area. If the infection goes deep into the follicle, these may turn into a boil (furuncle). A group  of boils (carbuncle). These tend to form in hairy, sweaty areas of the body. How is this diagnosed? This condition is diagnosed with a skin exam. Your health care provider may take a sample of one of the pustules or boils to test in a lab. How is this treated? This condition may be treated by: Putting a warm, wet cloth (warm compress) on the affected areas. Taking antibiotics or applying them to  the skin. Applying or bathing with a solution that kills germs (antiseptic). Taking an over-the-counter medicine. This can help with itching. Having a procedure to drain pustules or boils. This may be done if a pustule or boil contains a lot of pus or fluid. Having laser hair removal. This may be done when the condition lasts for a long time. Follow these instructions at home: Managing pain and swelling  If directed, apply heat to the affected area as often as told by your health care provider. Use the heat source that your health care provider recommends, such as a moist heat pack or a heating pad. Place a towel between your skin and the heat source. Leave the heat on for 20-30 minutes. If your skin turns bright red, remove the heat right away to prevent burns. The risk of burns is higher if you cannot feel pain, heat, or cold. General instructions Take over-the-counter and prescription medicines only as told by your health care provider. If you were prescribed antibiotics, take or apply them as told by your health care provider. Do not stop using the antibiotic even if you start to feel better. Check your irritated area every day for signs of infection. Check for: More redness, swelling, or pain. Fluid or blood. Warmth. Pus or a bad smell. Do not shave irritated skin. Keep all follow-up visits. Your health care provider will check if the treatments are helping. Contact a health care provider if: You have a fever. You have any signs of infection. Red streaks are spreading from the affected area. This information is not intended to replace advice given to you by your health care provider. Make sure you discuss any questions you have with your health care provider. Document Revised: 01/06/2022 Document Reviewed: 01/06/2022 Elsevier Patient Education  2023 [employer].        Due to recent changes in healthcare laws, you may see results of your pathology and/or laboratory studies  on MyChart before the doctors have had a chance to review them. We understand that in some cases there may be results that are confusing or concerning to you. Please understand that not all results are received at the same time and often the doctors may need to interpret multiple results in order to provide you with the best plan of care or course of treatment. Therefore, we ask that you please give [place] 2 business days to thoroughly review all your results before contacting the office for clarification. Should we see a critical lab result, you will be contacted sooner.   If You Need Anything After Your Visit  If you have any questions or concerns for your doctor, please call our main line at [phone] and press option 4 to reach your doctor's medical assistant. If no one answers, please leave a voicemail as directed and we will return your call as soon as possible. Messages left after 4 pm will be answered the following business day.   You may also send [place] a message via MyChart. We typically respond to MyChart messages within 1-2 business days.  For prescription refills, please ask your pharmacy to contact our office. Our fax number is [phone].  If you have an urgent issue when the clinic is closed that cannot wait until the next business day, you can page your doctor at the number below.    Please note that while we do our best to be available for urgent issues outside of office hours, we are not available 24/7.   If you have an urgent issue and are unable to reach [place], you may choose to seek medical care at your doctor's office, retail clinic, urgent care center, or emergency room.  If you have a medical emergency, please immediately call 911 or go to the emergency department.  Pager Numbers  - Dr. Gwen Pounds: [phone]  - Dr. Neale Burly: [phone]  - Dr. Roseanne Reno: [phone]  In the event of inclement weather, please call our main line at [phone] for an update on the status of  any delays or closures.  Dermatology Medication Tips: Please keep the boxes that topical medications come in in order to help keep track of the instructions about where and how to use these. Pharmacies typically print the medication instructions only on the boxes and not directly on the medication tubes.   If your medication is too expensive, please contact our office at [phone] option 4 or send [place] a message through MyChart.   We are unable to tell what your co-pay for medications will be in advance as this is different depending on your insurance coverage. However, we may be able to find a substitute medication at lower cost or fill out paperwork to get insurance to cover a needed medication.   If a prior authorization is required to get your medication covered by your insurance company, please allow [place] 1-2 business days to complete this process.  Drug prices often vary depending on where the prescription is filled and some pharmacies may offer cheaper prices.  The website www.goodrx.com contains coupons for medications through different pharmacies. The prices here do not account for what the cost may be with help from insurance (it may be cheaper with your insurance), but the website can give you the price if you did not use any insurance.  - You can print the associated coupon and take it with your prescription to the pharmacy.  - You may also stop by our office during regular business hours and pick up a GoodRx coupon card.  - If you need your prescription sent electronically to a different pharmacy, notify our office through [facility] or by phone at [phone] option 4.     Si Usted Necesita Algo Despus de Su Visita  Tambin puede enviarnos un mensaje a travs de Clinical cytogeneticist. Por lo general respondemos a los mensajes de MyChart en el transcurso de 1 a 2 das hbiles.  Para renovar recetas, por favor pida a su farmacia que se ponga en contacto con nuestra oficina. Annie Sable de fax es [place] [phone].  Si tiene un asunto urgente cuando la clnica est cerrada y que no puede esperar hasta el siguiente da hbil, puede llamar/localizar a su doctor(a) al nmero que aparece a continuacin.   Por favor, tenga en cuenta que aunque hacemos todo lo posible para estar disponibles para asuntos urgentes fuera del horario de [place], no estamos disponibles las 24 horas del da, los 7 [address]  [address] de la [place].   Si tiene un problema urgente y no puede comunicarse con nosotros, puede optar por buscar atencin mdica  en  el consultorio de su doctor(a), en una clnica privada, en un centro de atencin urgente o en una sala de emergencias.  Si tiene Engineer, drilling, por favor llame inmediatamente al 911 o vaya a la sala de emergencias.  Nmeros de bper  - Dr. Gwen Pounds: [phone]  - Dra. Moye: [phone]  - Dra. Roseanne Reno: [phone]  En caso de inclemencias del [place], por favor llame a Lacy Duverney principal al [phone] para una actualizacin sobre el [place] de cualquier retraso o cierre.  Consejos para la medicacin en dermatologa: Por favor, guarde las cajas en las que vienen los medicamentos de uso tpico para ayudarle a seguir las instrucciones sobre dnde y cmo usarlos. Las farmacias generalmente imprimen las instrucciones del medicamento slo en las cajas y no directamente en los tubos del [place].   Si su medicamento es muy caro, por favor, pngase en contacto con Rolm [place]ala llamando al [phone] y presione la opcin 4 o envenos un mensaje a travs de Clinical cytogeneticist.   No podemos decirle cul ser su copago por los medicamentos por adelantado ya que esto es diferente dependiendo de la cobertura de su seguro. Sin embargo, es posible que podamos encontrar un medicamento sustituto a Audiological scientist un formulario para que el seguro cubra el medicamento que se considera necesario.   Si se requiere una autorizacin previa para que su compaa de  seguros [place] su medicamento, por favor permtanos de 1 a 2 das hbiles para completar [address]  Los precios de los medicamentos varan con frecuencia dependiendo del Environmental consultant de dnde se surte la receta y alguna farmacias pueden ofrecer precios ms baratos.  El sitio web www.goodrx.com tiene cupones para medicamentos de Health and safety inspector. Los precios aqu no tienen en cuenta lo que podra costar con la ayuda del seguro (puede ser ms barato con su seguro), pero el sitio web puede darle el precio si no utiliz Tourist information centre manager.  - Puede imprimir el cupn correspondiente y llevarlo con su receta a la farmacia.  - Tambin puede pasar por nuestra oficina durante el horario de atencin regular y Education officer, museum una tarjeta de cupones de GoodRx.  - Si necesita que su receta se enve electrnicamente a una farmacia diferente, informe a nuestra oficina a travs de MyChart de [place] o por telfono llamando al [phone] y presione la opcin 4.

## 2022-12-15 ENCOUNTER — Other Ambulatory Visit: Payer: Self-pay | Admitting: Family Medicine

## 2022-12-15 DIAGNOSIS — E039 Hypothyroidism, unspecified: Secondary | ICD-10-CM

## 2022-12-16 NOTE — Telephone Encounter (Signed)
Requested medication (s) are due for refill today: yes  Requested medication (s) are on the active medication list: yes  Last refill:  09/16/22  Future visit scheduled: yes  Notes to clinic:  Unable to refill per protocol due to failed labs, no TSH updated results.      Requested Prescriptions  Pending Prescriptions Disp Refills   SYNTHROID 100 MCG tablet [Pharmacy Med Name: SYNTHROID TABS 100MCG] 90 tablet 3    Sig: TAKE 1 TABLET DAILY (PLEASE SCHEDULE OFFICE VISIT BEFORE ANY FUTURE REFILL)     Endocrinology:  Hypothyroid Agents Failed - 12/15/2022  3:09 AM      Failed - TSH in normal range and within 360 days    TSH  Date Value Ref Range Status  06/04/2021 2.600 0.450 - 4.500 uIU/mL Final         Passed - Valid encounter within last 12 months    Recent Outpatient Visits           1 month ago Laryngitis   [facility] Alfredia Ferguson, PA-C   11 months ago Adult hypothyroidism   [place] [facility] Bosie Clos, MD   1 year ago Annual physical exam   [facility] Bosie Clos, MD   2 years ago Laryngitis   [facility] Just, Azalee Course, FNP   2 years ago Adult hypothyroidism   [facility] Bosie Clos, MD       Future Appointments             In 6 months Deirdre Evener, MD [facility]

## 2022-12-18 ENCOUNTER — Encounter: Payer: Self-pay | Admitting: Dermatology

## 2022-12-30 ENCOUNTER — Other Ambulatory Visit: Payer: Self-pay | Admitting: Physician Assistant

## 2022-12-30 DIAGNOSIS — R3915 Urgency of urination: Secondary | ICD-10-CM | POA: Diagnosis not present

## 2022-12-30 DIAGNOSIS — E079 Disorder of thyroid, unspecified: Secondary | ICD-10-CM | POA: Diagnosis not present

## 2022-12-30 DIAGNOSIS — E782 Mixed hyperlipidemia: Secondary | ICD-10-CM | POA: Diagnosis not present

## 2022-12-30 DIAGNOSIS — E039 Hypothyroidism, unspecified: Secondary | ICD-10-CM

## 2022-12-30 DIAGNOSIS — Z0000 Encounter for general adult medical examination without abnormal findings: Secondary | ICD-10-CM | POA: Diagnosis not present

## 2022-12-31 NOTE — Telephone Encounter (Signed)
Unable to refill per protocol, courtesy refill already given, OV needed for additional refills. Duplicate request.  Requested Prescriptions  Pending Prescriptions Disp Refills   SYNTHROID 100 MCG tablet [Pharmacy Med Name: SYNTHROID TABS 100MCG] 30 tablet 11    Sig: TAKE 1 TABLET DAILY (PLEASE SCHEDULE OFFICE VISIT BEFORE ANY FUTURE REFILLS, NEEDS APPOINTMENT FOR LABS)     Endocrinology:  Hypothyroid Agents Failed - 12/30/2022  8:10 PM      Failed - TSH in normal range and within 360 days    TSH  Date Value Ref Range Status  06/04/2021 2.600 0.450 - 4.500 uIU/mL Final         Passed - Valid encounter within last 12 months    Recent Outpatient Visits           1 month ago Laryngitis   [facility] Alfredia Ferguson, PA-C   1 year ago Adult hypothyroidism   [place] [facility] Bosie Clos, MD   1 year ago Annual physical exam   [facility] Bosie Clos, MD   2 years ago Laryngitis   [facility] Just, Azalee Course, FNP   2 years ago Adult hypothyroidism   [facility] Bosie Clos, MD       Future Appointments             In 5 months Deirdre Evener, MD [facility]

## 2023-01-15 ENCOUNTER — Telehealth: Payer: Self-pay | Admitting: Physician Assistant

## 2023-01-15 NOTE — Telephone Encounter (Signed)
Contacted John Baxter to schedule their annual wellness visit. Patient declined to schedule AWV at this time.  Patient followed Dr.Richard Sullivan Lone to his new practice.  Thank you,  [facility] Support [facility] Direct dial  [phone]

## 2023-03-12 ENCOUNTER — Ambulatory Visit: Payer: Self-pay

## 2023-03-12 NOTE — Patient Outreach (Signed)
  Care Coordination   03/12/2023 Name: John Baxter MRN: [id number] DOB: [birth year]   Care Coordination Outreach Attempts:  An unsuccessful telephone outreach was attempted today to offer the patient information about available care coordination services.  Follow Up Plan:  Additional outreach attempts will be made to offer the patient care coordination information and services.   Encounter Outcome:  No Answer   Care Coordination Interventions:  Yes, provided    SIG Lysle Morales, BSW Social Worker [facility] Care Management  [phone]

## 2023-03-26 ENCOUNTER — Ambulatory Visit: Payer: Self-pay

## 2023-03-26 NOTE — Patient Outreach (Signed)
  Care Coordination   03/26/2023 Name: John Baxter MRN: [id number] DOB: [birth year]   Care Coordination Outreach Attempts:  A second unsuccessful outreach was attempted today to offer the patient with information about available care coordination services.  Follow Up Plan:  Additional outreach attempts will be made to offer the patient care coordination information and services.   Encounter Outcome:  No Answer   Care Coordination Interventions:  No, not indicated    SIG Lysle Morales, BSW Social Worker [facility] Care Management  [phone]

## 2023-04-06 ENCOUNTER — Ambulatory Visit: Payer: Self-pay

## 2023-04-06 NOTE — Patient Outreach (Signed)
  Care Coordination   04/06/2023 Name: John Baxter MRN: [id number] DOB: 05/21/1951   Care Coordination Outreach Attempts:  A third unsuccessful outreach was attempted today to offer the patient with information about available care coordination services.  Follow Up Plan:  No further outreach attempts will be made at this time. We have been unable to contact the patient to offer or enroll patient in care coordination services  Encounter Outcome:  No Answer   Care Coordination Interventions:  No, not indicated    Lysle Morales, BSW Social Worker [facility] Care Management  [phone]

## 2023-06-24 ENCOUNTER — Ambulatory Visit: Payer: Medicare Other | Admitting: Dermatology

## 2023-06-24 DIAGNOSIS — L820 Inflamed seborrheic keratosis: Secondary | ICD-10-CM

## 2023-06-24 DIAGNOSIS — Z1283 Encounter for screening for malignant neoplasm of skin: Secondary | ICD-10-CM | POA: Diagnosis not present

## 2023-06-24 DIAGNOSIS — I781 Nevus, non-neoplastic: Secondary | ICD-10-CM

## 2023-06-24 DIAGNOSIS — W908XXA Exposure to other nonionizing radiation, initial encounter: Secondary | ICD-10-CM | POA: Diagnosis not present

## 2023-06-24 DIAGNOSIS — L570 Actinic keratosis: Secondary | ICD-10-CM | POA: Diagnosis not present

## 2023-06-24 DIAGNOSIS — L578 Other skin changes due to chronic exposure to nonionizing radiation: Secondary | ICD-10-CM

## 2023-06-24 DIAGNOSIS — I8393 Asymptomatic varicose veins of bilateral lower extremities: Secondary | ICD-10-CM

## 2023-06-24 DIAGNOSIS — L814 Other melanin hyperpigmentation: Secondary | ICD-10-CM

## 2023-06-24 DIAGNOSIS — D1801 Hemangioma of skin and subcutaneous tissue: Secondary | ICD-10-CM

## 2023-06-24 DIAGNOSIS — L821 Other seborrheic keratosis: Secondary | ICD-10-CM

## 2023-06-24 DIAGNOSIS — D229 Melanocytic nevi, unspecified: Secondary | ICD-10-CM

## 2023-06-24 NOTE — Patient Instructions (Signed)

## 2023-06-24 NOTE — Progress Notes (Signed)
Follow-Up Visit   Subjective  John Baxter is a 72 y.o. male who presents for the following: Skin Cancer Screening and Full Body Skin Exam  The patient presents for Total-Body Skin Exam (TBSE) for skin cancer screening and mole check. The patient has spots, moles and lesions to be evaluated, some may be new or changing and the patient may have concern these could be cancer.   The following portions of the chart were reviewed this encounter and updated as appropriate: medications, allergies, medical history  Review of Systems:  No other skin or systemic complaints except as noted in HPI or Assessment and Plan.  Objective  Well appearing patient in no apparent distress; mood and affect are within normal limits.  A full examination was performed including scalp, head, eyes, ears, nose, lips, neck, chest, axillae, abdomen, back, buttocks, bilateral upper extremities, bilateral lower extremities, hands, feet, fingers, toes, fingernails, and toenails. All findings within normal limits unless otherwise noted below.   Relevant physical exam findings are noted in the Assessment and Plan.  L temple x 1, R hand x 2 (3) Erythematous thin papules/macules with gritty scale.   L arm x 2, R arm x 1, back x 1, L pretibial x 1 (5) Erythematous stuck-on, waxy papule or plaque    Assessment & Plan   SKIN CANCER SCREENING PERFORMED TODAY.  ACTINIC DAMAGE - Chronic condition, secondary to cumulative UV/sun exposure - diffuse scaly erythematous macules with underlying dyspigmentation - Recommend daily broad spectrum sunscreen SPF 30+ to sun-exposed areas, reapply every 2 hours as needed.  - Staying in the shade or wearing long sleeves, sun glasses (UVA+UVB protection) and wide brim hats (4-inch brim around the entire circumference of the hat) are also recommended for sun protection.  - Call for new or changing lesions.  LENTIGINES, SEBORRHEIC KERATOSES, HEMANGIOMAS - Benign normal skin  lesions - Benign-appearing - Call for any changes  MELANOCYTIC NEVI - Tan-brown and/or pink-flesh-colored symmetric macules and papules - Benign appearing on exam today - Observation - Call clinic for new or changing moles - Recommend daily use of broad spectrum spf 30+ sunscreen to sun-exposed areas.   AK (actinic keratosis) (3) L temple x 1, R hand x 2  Destruction of lesion - L temple x 1, R hand x 2 (3) Complexity: simple   Destruction method: cryotherapy   Informed consent: discussed and consent obtained   Timeout:  patient name, date of birth, surgical site, and procedure verified Lesion destroyed using liquid nitrogen: Yes   Region frozen until ice ball extended beyond lesion: Yes   Outcome: patient tolerated procedure well with no complications   Post-procedure details: wound care instructions given    Inflamed seborrheic keratosis (5) L arm x 2, R arm x 1, back x 1, L pretibial x 1  Symptomatic, irritating, patient would like treated.   Destruction of lesion - L arm x 2, R arm x 1, back x 1, L pretibial x 1 (5) Complexity: simple   Destruction method: cryotherapy   Informed consent: discussed and consent obtained   Timeout:  patient name, date of birth, surgical site, and procedure verified Lesion destroyed using liquid nitrogen: Yes   Region frozen until ice ball extended beyond lesion: Yes   Outcome: patient tolerated procedure well with no complications   Post-procedure details: wound care instructions given    Varicose Veins/Spider Veins - Dilated blue, purple or red veins at the lower extremities - Reassured - Smaller vessels can be  treated by sclerotherapy (a procedure to inject a medicine into the veins to make them disappear) if desired, but the treatment is not covered by insurance. Larger vessels may be covered if symptomatic and we would refer to vascular surgeon if treatment desired.  Return in about 1 year (around 06/23/2024) for TBSE.  Maylene Roes, CMA, am acting as scribe for Armida Sans, MD .   Documentation: I have reviewed the above documentation for accuracy and completeness, and I agree with the above.  Armida Sans, MD

## 2023-07-03 ENCOUNTER — Encounter: Payer: Self-pay | Admitting: Dermatology

## 2024-06-28 ENCOUNTER — Encounter: Payer: Self-pay | Admitting: Dermatology

## 2024-06-28 ENCOUNTER — Ambulatory Visit: Payer: Medicare Other | Admitting: Dermatology

## 2024-06-28 DIAGNOSIS — W908XXA Exposure to other nonionizing radiation, initial encounter: Secondary | ICD-10-CM | POA: Diagnosis not present

## 2024-06-28 DIAGNOSIS — Z1283 Encounter for screening for malignant neoplasm of skin: Secondary | ICD-10-CM | POA: Diagnosis not present

## 2024-06-28 DIAGNOSIS — L570 Actinic keratosis: Secondary | ICD-10-CM | POA: Diagnosis not present

## 2024-06-28 DIAGNOSIS — L821 Other seborrheic keratosis: Secondary | ICD-10-CM

## 2024-06-28 DIAGNOSIS — L814 Other melanin hyperpigmentation: Secondary | ICD-10-CM | POA: Diagnosis not present

## 2024-06-28 DIAGNOSIS — D692 Other nonthrombocytopenic purpura: Secondary | ICD-10-CM

## 2024-06-28 DIAGNOSIS — L578 Other skin changes due to chronic exposure to nonionizing radiation: Secondary | ICD-10-CM

## 2024-06-28 DIAGNOSIS — D229 Melanocytic nevi, unspecified: Secondary | ICD-10-CM

## 2024-06-28 DIAGNOSIS — D1801 Hemangioma of skin and subcutaneous tissue: Secondary | ICD-10-CM

## 2024-06-28 NOTE — Progress Notes (Signed)
 Follow-Up Visit   Subjective  John Baxter is a 73 y.o. male who presents for the following: Skin Cancer Screening and Full Body Skin Exam Hx of aks and isks   The patient presents for Total-Body Skin Exam (TBSE) for skin cancer screening and mole check. The patient has spots, moles and lesions to be evaluated, some may be new or changing and the patient may have concern these could be cancer.  The following portions of the chart were reviewed this encounter and updated as appropriate: medications, allergies, medical history  Review of Systems:  No other skin or systemic complaints except as noted in HPI or Assessment and Plan.  Objective  Well appearing patient in no apparent distress; mood and affect are within normal limits.  A full examination was performed including scalp, head, eyes, ears, nose, lips, neck, chest, axillae, abdomen, back, buttocks, bilateral upper extremities, bilateral lower extremities, hands, feet, fingers, toes, fingernails, and toenails. All findings within normal limits unless otherwise noted below.   Relevant physical exam findings are noted in the Assessment and Plan.  arms and hands x 5, face and nose x 8 (10) Erythematous thin papules/macules with gritty scale.   Assessment & Plan   SKIN CANCER SCREENING PERFORMED TODAY.  ACTINIC DAMAGE - Chronic condition, secondary to cumulative UV/sun exposure - diffuse scaly erythematous macules with underlying dyspigmentation - Recommend daily broad spectrum sunscreen SPF 30+ to sun-exposed areas, reapply every 2 hours as needed.  - Staying in the shade or wearing long sleeves, sun glasses (UVA+UVB protection) and wide brim hats (4-inch brim around the entire circumference of the hat) are also recommended for sun protection.  - Call for new or changing lesions.  LENTIGINES, SEBORRHEIC KERATOSES, HEMANGIOMAS - Benign normal skin lesions - Benign-appearing - Call for any changes  MELANOCYTIC NEVI -  Tan-brown and/or pink-flesh-colored symmetric macules and papules - Benign appearing on exam today - Observation - Call clinic for new or changing moles - Recommend daily use of broad spectrum spf 30+ sunscreen to sun-exposed areas.   Purpura - Chronic; persistent and recurrent.  Treatable, but not curable. - Violaceous macules and patches - Benign - Related to trauma, age, sun damage and/or use of blood thinners, chronic use of topical and/or oral steroids - Observe - Can use OTC arnica containing moisturizer such as Dermend Bruise Formula if desired - Call for worsening or other concerns  ACTINIC KERATOSIS (10) arms and hands x 5, face and nose x 8 (10)  Actinic keratoses are precancerous spots that appear secondary to cumulative UV radiation exposure/sun exposure over time. They are chronic with expected duration over 1 year. A portion of actinic keratoses will progress to squamous cell carcinoma of the skin. It is not possible to reliably predict which spots will progress to skin cancer and so treatment is recommended to prevent development of skin cancer.  Recommend daily broad spectrum sunscreen SPF 30+ to sun-exposed areas, reapply every 2 hours as needed.  Recommend staying in the shade or wearing long sleeves, sun glasses (UVA+UVB protection) and wide brim hats (4-inch brim around the entire circumference of the hat). Call for new or changing lesions. Destruction of lesion - arms and hands x 5, face and nose x 8 (10) Complexity: simple   Destruction method: cryotherapy   Informed consent: discussed and consent obtained   Timeout:  patient name, date of birth, surgical site, and procedure verified Lesion destroyed using liquid nitrogen: Yes   Region frozen until ice ball  extended beyond lesion: Yes   Outcome: patient tolerated procedure well with no complications   Post-procedure details: wound care instructions given    Return for 8 month ak follow up and 1 year tbse .  IEleanor Blush, CMA, am acting as scribe for Alm Rhyme, MD.   Documentation: I have reviewed the above documentation for accuracy and completeness, and I agree with the above.  Alm Rhyme, MD

## 2024-06-28 NOTE — Patient Instructions (Addendum)
 Cryotherapy Aftercare  Wash gently with soap and water everyday.   Apply Vaseline and Band-Aid daily until healed.   Actinic keratoses are precancerous spots that appear secondary to cumulative UV radiation exposure/sun exposure over time. They are chronic with expected duration over 1 year. A portion of actinic keratoses will progress to squamous cell carcinoma of the skin. It is not possible to reliably predict which spots will progress to skin cancer and so treatment is recommended to prevent development of skin cancer.  Recommend daily broad spectrum sunscreen SPF 30+ to sun-exposed areas, reapply every 2 hours as needed.  Recommend staying in the shade or wearing long sleeves, sun glasses (UVA+UVB protection) and wide brim hats (4-inch brim around the entire circumference of the hat). Call for new or changing lesions.    Melanoma ABCDEs  Melanoma is the most dangerous type of skin cancer, and is the leading cause of death from skin disease.  You are more likely to develop melanoma if you: Have light-colored skin, light-colored eyes, or red or blond hair Spend a lot of time in the sun Tan regularly, either outdoors or in a tanning bed Have had blistering sunburns, especially during childhood Have a close family member who has had a melanoma Have atypical moles or large birthmarks  Early detection of melanoma is key since treatment is typically straightforward and cure rates are extremely high if we catch it early.   The first sign of melanoma is often a change in a mole or a new dark spot.  The ABCDE system is a way of remembering the signs of melanoma.  A for asymmetry:  The two halves do not match. B for border:  The edges of the growth are irregular. C for color:  A mixture of colors are present instead of an even brown color. D for diameter:  Melanomas are usually (but not always) greater than 6mm - the size of a pencil eraser. E for evolution:  The spot keeps changing in size,  shape, and color.  Please check your skin once per month between visits. You can use a small mirror in front and a large mirror behind you to keep an eye on the back side or your body.   If you see any new or changing lesions before your next follow-up, please call to schedule a visit.  Please continue daily skin protection including broad spectrum sunscreen SPF 30+ to sun-exposed areas, reapplying every 2 hours as needed when you're outdoors.   Staying in the shade or wearing long sleeves, sun glasses (UVA+UVB protection) and wide brim hats (4-inch brim around the entire circumference of the hat) are also recommended for sun protection.     Due to recent changes in healthcare laws, you may see results of your pathology and/or laboratory studies on MyChart before the doctors have had a chance to review them. We understand that in some cases there may be results that are confusing or concerning to you. Please understand that not all results are received at the same time and often the doctors may need to interpret multiple results in order to provide you with the best plan of care or course of treatment. Therefore, we ask that you please give us  2 business days to thoroughly review all your results before contacting the office for clarification. Should we see a critical lab result, you will be contacted sooner.   If You Need Anything After Your Visit  If you have any questions or concerns for your  doctor, please call our main line at [phone] and press option 4 to reach your doctor's medical assistant. If no one answers, please leave a voicemail as directed and we will return your call as soon as possible. Messages left after 4 pm will be answered the following business day.   You may also send us  a message via MyChart. We typically respond to MyChart messages within 1-2 business days.  For prescription refills, please ask your pharmacy to contact our office. Our fax number is  [phone].  If you have an urgent issue when the clinic is closed that cannot wait until the next business day, you can page your doctor at the number below.    Please note that while we do our best to be available for urgent issues outside of office hours, we are not available 24/7.   If you have an urgent issue and are unable to reach us , you may choose to seek medical care at your doctor's office, retail clinic, urgent care center, or emergency room.  If you have a medical emergency, please immediately call 911 or go to the emergency department.  Pager Numbers  - Dr. Hester: [phone]  - Dr. Jackquline: [phone]  - Dr. Claudene: [phone]   - Dr. Raymund: [phone]  In the event of inclement weather, please call our main line at [phone] for an update on the status of any delays or closures.  Dermatology Medication Tips: Please keep the boxes that topical medications come in in order to help keep track of the instructions about where and how to use these. Pharmacies typically print the medication instructions only on the boxes and not directly on the medication tubes.   If your medication is too expensive, please contact our office at [phone] option 4 or send us  a message through MyChart.   We are unable to tell what your co-pay for medications will be in advance as this is different depending on your insurance coverage. However, we may be able to find a substitute medication at lower cost or fill out paperwork to get insurance to cover a needed medication.   If a prior authorization is required to get your medication covered by your insurance company, please allow us  1-2 business days to complete this process.  Drug prices often vary depending on where the prescription is filled and some pharmacies may offer cheaper prices.  The website www.goodrx.com contains coupons for medications through different pharmacies. The prices here do not account for what the cost  may be with help from insurance (it may be cheaper with your insurance), but the website can give you the price if you did not use any insurance.  - You can print the associated coupon and take it with your prescription to the pharmacy.  - You may also stop by our office during regular business hours and pick up a GoodRx coupon card.  - If you need your prescription sent electronically to a different pharmacy, notify our office through [facility] or by phone at [phone] option 4.     Si Usted Necesita Algo Despus de Su Visita  Tambin puede enviarnos un mensaje a travs de Clinical cytogeneticist. Por lo general respondemos a los mensajes de MyChart en el transcurso de 1 a 2 das hbiles.  Para renovar recetas, por favor pida a su farmacia que se ponga en contacto con nuestra oficina. Randi lakes de fax es [place] [phone].  Si tiene un asunto urgente cuando la clnica est cerrada y que  no puede esperar hasta el siguiente da hbil, puede llamar/localizar a su doctor(a) al nmero que aparece a continuacin.   Por favor, tenga en cuenta que aunque hacemos todo lo posible para estar disponibles para asuntos urgentes fuera del horario de [place], no estamos disponibles las 24 horas del da, los 7 [address]  [address] de la [place].   Si tiene un problema urgente y no puede comunicarse con nosotros, puede optar por buscar atencin mdica  en el consultorio de su doctor(a), en una clnica privada, en un centro de atencin urgente o en una sala de emergencias.  Si tiene Engineer, drilling, por favor llame inmediatamente al 911 o vaya a la sala de emergencias.  Nmeros de bper  - Dr. Hester: [phone]  - Dra. Jackquline: [phone]  - Dr. Claudene: [phone]  - Dra. Kitts: [phone]  En caso de inclemencias del [place], por favor llame a nuestra lnea principal al [phone] para una actualizacin sobre el estado de cualquier retraso o cierre.  Consejos para la medicacin en dermatologa: Por  favor, guarde las cajas en las que vienen los medicamentos de uso tpico para ayudarle a seguir las instrucciones sobre dnde y cmo usarlos. Las farmacias generalmente imprimen las instrucciones del medicamento slo en las cajas y no directamente en los tubos del [place].   Si su medicamento es muy caro, por favor, pngase en contacto con landry rieger llamando al [phone] y presione la opcin 4 o envenos un mensaje a travs de Clinical cytogeneticist.   No podemos decirle cul ser su copago por los medicamentos por adelantado ya que esto es diferente dependiendo de la cobertura de su seguro. Sin embargo, es posible que podamos encontrar un medicamento sustituto a Audiological scientist un formulario para que el seguro cubra el medicamento que se considera necesario.   Si se requiere una autorizacin previa para que su compaa de seguros malta su medicamento, por favor permtanos de 1 a 2 das hbiles para completar este proceso.  Los precios de los medicamentos varan con frecuencia dependiendo del Environmental consultant de dnde se surte la receta y alguna farmacias pueden ofrecer precios ms baratos.  El sitio web www.goodrx.com tiene cupones para medicamentos de Health and safety inspector. Los precios aqu no tienen en cuenta lo que podra costar con la ayuda del seguro (puede ser ms barato con su seguro), pero el sitio web puede darle el precio si no utiliz Tourist information centre manager.  - Puede imprimir el cupn correspondiente y llevarlo con su receta a la farmacia.  - Tambin puede pasar por nuestra oficina durante el horario de atencin regular y Education officer, museum una tarjeta de cupones de GoodRx.  - Si necesita que su receta se enve electrnicamente a una farmacia diferente, informe a nuestra oficina a travs de MyChart de [place] o por telfono llamando al [phone] y presione la opcin 4.

## 2025-02-21 ENCOUNTER — Ambulatory Visit: Admitting: Dermatology

## 2025-06-28 ENCOUNTER — Ambulatory Visit: Admitting: Dermatology
